# Patient Record
Sex: Male | Born: 1975 | Race: Black or African American | Hispanic: No | Marital: Married | Smoking: Current some day smoker
Health system: Southern US, Community
[De-identification: ages and names within clinical notes are randomized; demographics above are authoritative.]

## PROBLEM LIST (undated history)

## (undated) DIAGNOSIS — R079 Chest pain, unspecified: Secondary | ICD-10-CM

## (undated) DIAGNOSIS — I10 Essential (primary) hypertension: Secondary | ICD-10-CM

## (undated) DIAGNOSIS — E785 Hyperlipidemia, unspecified: Secondary | ICD-10-CM

## (undated) DIAGNOSIS — Z720 Tobacco use: Secondary | ICD-10-CM

## (undated) DIAGNOSIS — R011 Cardiac murmur, unspecified: Secondary | ICD-10-CM

## (undated) DIAGNOSIS — N529 Male erectile dysfunction, unspecified: Secondary | ICD-10-CM

## (undated) HISTORY — DX: Hyperlipidemia, unspecified: E78.5

## (undated) HISTORY — DX: Tobacco use: Z72.0

## (undated) HISTORY — DX: Cardiac murmur, unspecified: R01.1

## (undated) HISTORY — DX: Male erectile dysfunction, unspecified: N52.9

---

## 2005-01-27 ENCOUNTER — Emergency Department (HOSPITAL_COMMUNITY): Admission: EM | Admit: 2005-01-27 | Discharge: 2005-01-27 | Payer: Self-pay | Admitting: Emergency Medicine

## 2005-08-27 ENCOUNTER — Emergency Department (HOSPITAL_COMMUNITY): Admission: EM | Admit: 2005-08-27 | Discharge: 2005-08-27 | Payer: Self-pay | Admitting: Family Medicine

## 2007-05-20 ENCOUNTER — Emergency Department (HOSPITAL_COMMUNITY): Admission: EM | Admit: 2007-05-20 | Discharge: 2007-05-21 | Payer: Self-pay | Admitting: Emergency Medicine

## 2007-05-27 ENCOUNTER — Emergency Department (HOSPITAL_COMMUNITY): Admission: EM | Admit: 2007-05-27 | Discharge: 2007-05-27 | Payer: Self-pay | Admitting: Emergency Medicine

## 2007-06-09 ENCOUNTER — Ambulatory Visit: Payer: Self-pay | Admitting: Cardiology

## 2007-06-17 ENCOUNTER — Observation Stay (HOSPITAL_COMMUNITY): Admission: AD | Admit: 2007-06-17 | Discharge: 2007-06-17 | Payer: Self-pay | Admitting: Cardiovascular Disease

## 2007-06-17 HISTORY — PX: CARDIAC CATHETERIZATION: SHX172

## 2007-07-08 ENCOUNTER — Inpatient Hospital Stay (HOSPITAL_COMMUNITY): Admission: EM | Admit: 2007-07-08 | Discharge: 2007-07-11 | Payer: Self-pay | Admitting: Emergency Medicine

## 2007-07-08 ENCOUNTER — Ambulatory Visit: Payer: Self-pay | Admitting: Cardiology

## 2007-07-30 ENCOUNTER — Emergency Department (HOSPITAL_COMMUNITY): Admission: EM | Admit: 2007-07-30 | Discharge: 2007-07-30 | Payer: Self-pay | Admitting: Emergency Medicine

## 2007-10-26 ENCOUNTER — Emergency Department (HOSPITAL_COMMUNITY): Admission: EM | Admit: 2007-10-26 | Discharge: 2007-10-26 | Payer: Self-pay | Admitting: Emergency Medicine

## 2007-11-29 ENCOUNTER — Emergency Department (HOSPITAL_COMMUNITY): Admission: EM | Admit: 2007-11-29 | Discharge: 2007-11-30 | Payer: Self-pay | Admitting: Emergency Medicine

## 2007-12-19 ENCOUNTER — Emergency Department (HOSPITAL_COMMUNITY): Admission: EM | Admit: 2007-12-19 | Discharge: 2007-12-20 | Payer: Self-pay | Admitting: Emergency Medicine

## 2008-03-05 ENCOUNTER — Emergency Department (HOSPITAL_COMMUNITY): Admission: EM | Admit: 2008-03-05 | Discharge: 2008-03-05 | Payer: Self-pay | Admitting: Emergency Medicine

## 2008-09-24 IMAGING — CR DG CHEST 2V
2 series · 2 of 2 positions shown · non-contrast
Comparison: 10/26/2007.

CLINICAL DATA: Left chest pain.

CHEST - 2 VIEW

[w chest pa]
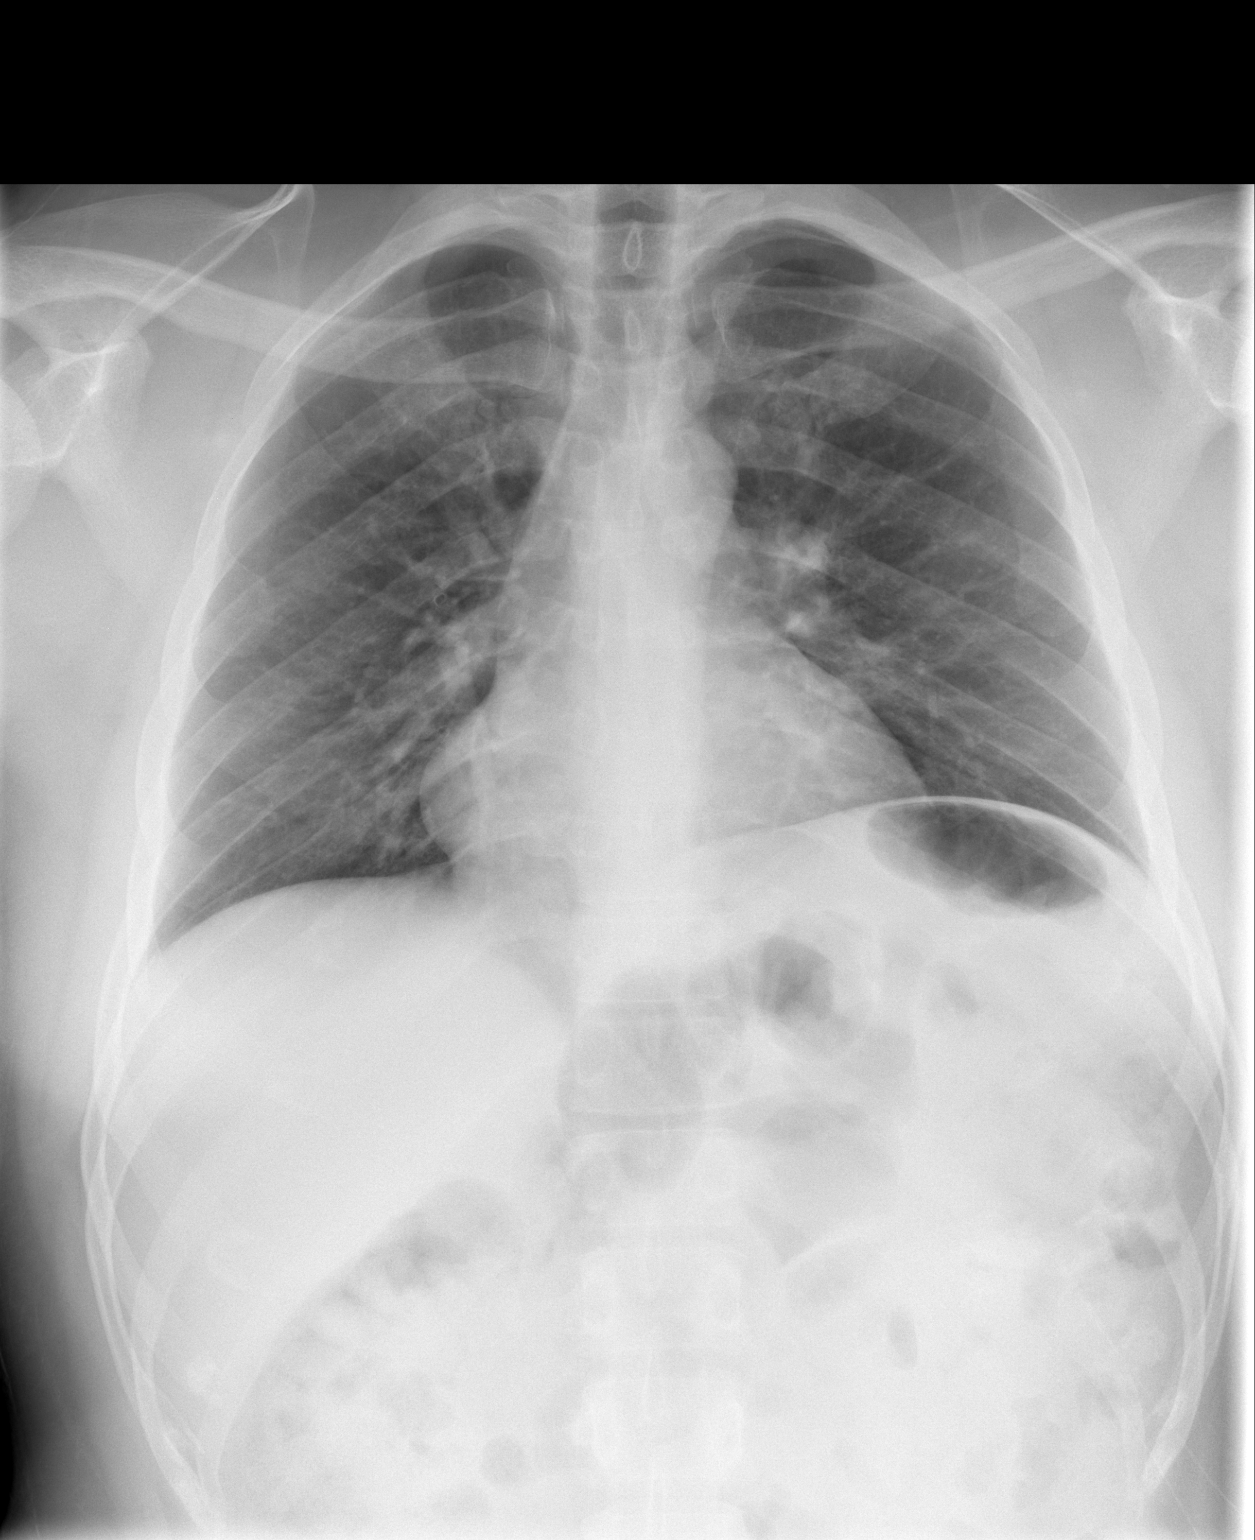

[w chest lat]
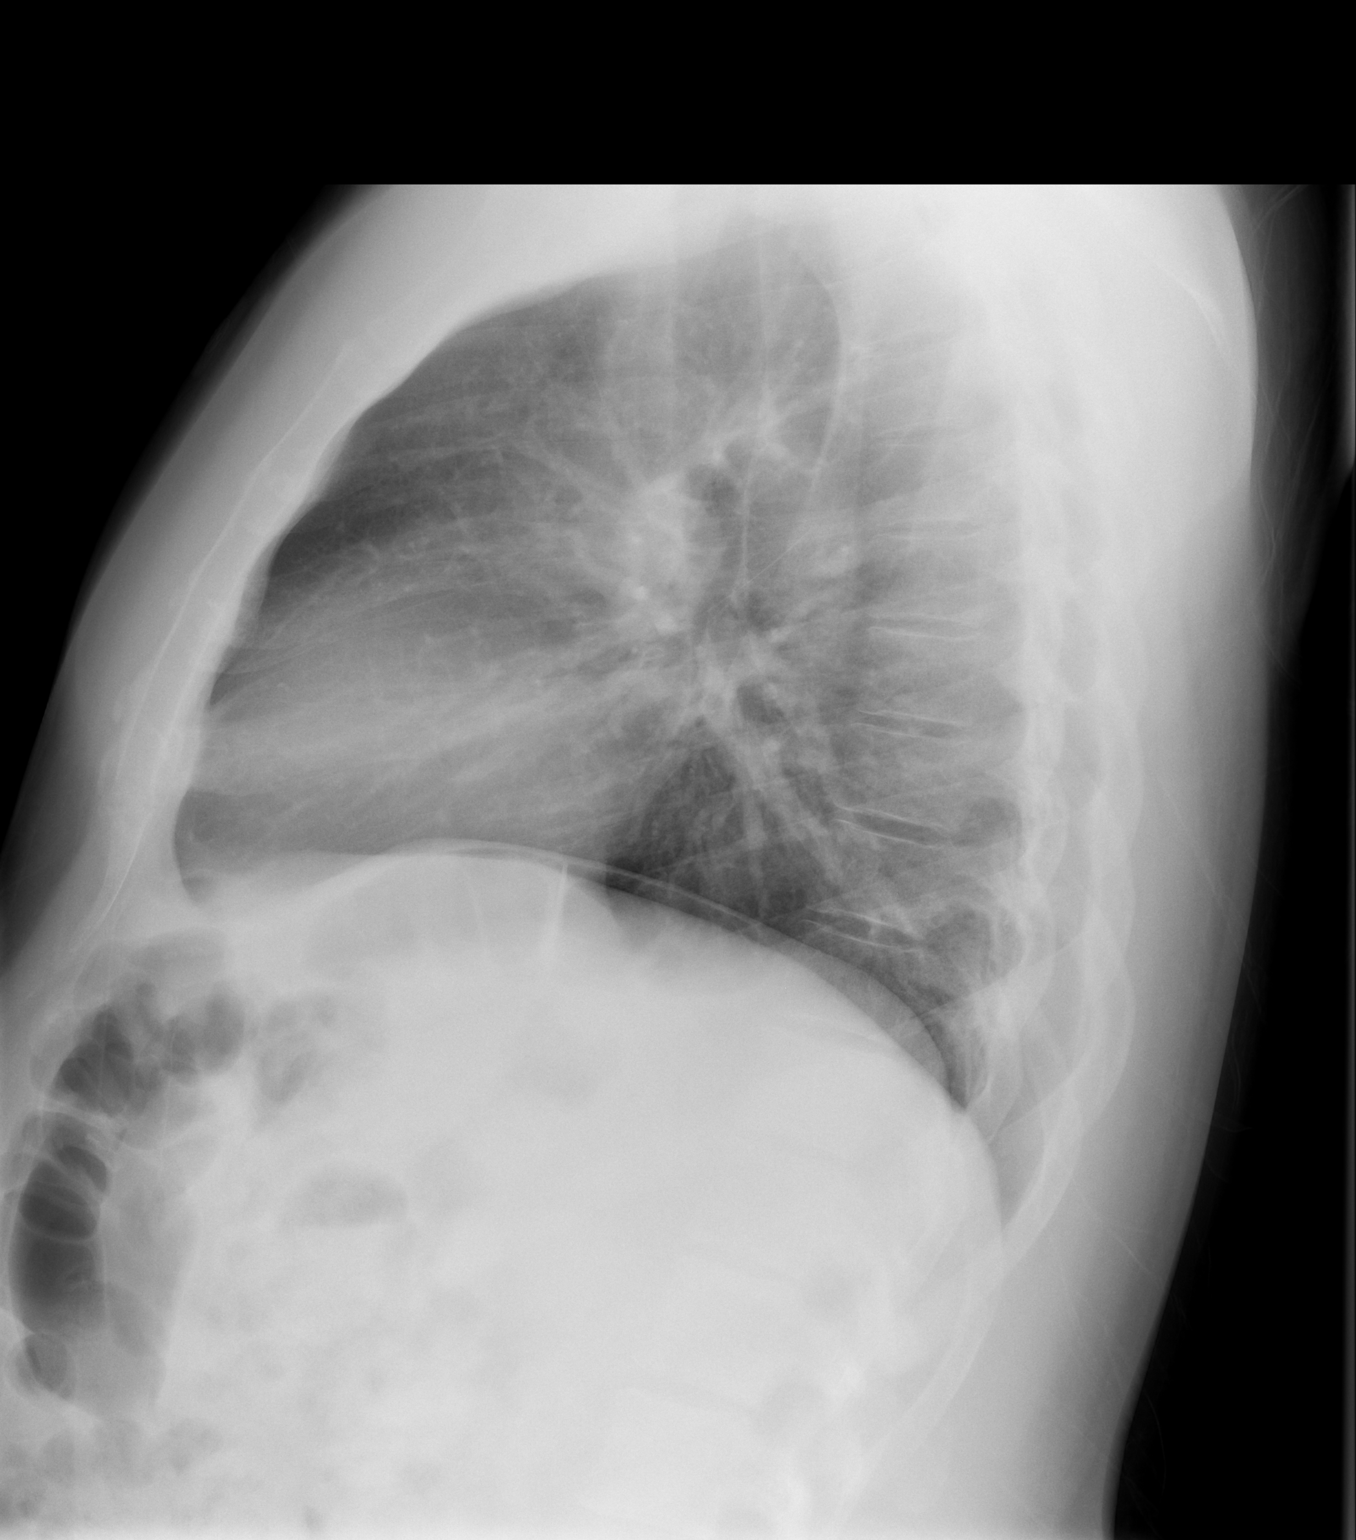

[2 of 2 positions shown; findings below may reference images not displayed]

FINDINGS: The heart size and mediastinal contours are stable.  The
lungs are clear.  There is no pleural effusion or pneumothorax.  No
acute osseous findings are seen.
IMPRESSION: Stable examination.  No acute chest findings.

## 2010-11-11 NOTE — Procedures (Signed)
NAME:  SIAOSI, ALTER NO.:  [id number]   MEDICAL RECORD NO.:  [id number]          PATIENT TYPE:  INP   LOCATION:  A210                          FACILITY:  [facility]   PHYSICIAN:  Gerrit Friends. Dietrich Pates, MD, FACCDATE OF BIRTH:  Sep 17, 1975   DATE OF PROCEDURE:  07/11/2007  DATE OF DISCHARGE:  07/11/2007                                ECHOCARDIOGRAM   REFERRING PHYSICIAN:  Dr. Lilian Kapur and Dr. Dietrich Pates.   CLINICAL DATA:  A 35 year old gentleman with chest pain and  hypertension.   Aorta 2.9, left atrium 4.2.   1. Technically adequate echocardiographic study.  2. Mild left atrial enlargement; normal right atrium and right      ventricle.  3. Normal aortic valve; normal proximal ascending aorta.  4. Normal mitral valve with trivial regurgitation.  5. Normal tricuspid valve with physiologic regurgitation.  6. Normal left ventricular size; borderline septal hypertrophy; some      could degree of hypokinesis at the base of the inferior wall;      overall LV systolic function is normal.      Gerrit Friends. Dietrich Pates, MD, [facility]  Electronically Signed     RMR/MEDQ  D:  07/13/2007  T:  07/13/2007  Job:  [id number]

## 2010-11-11 NOTE — Group Therapy Note (Signed)
NAME:  KARTEL, William NO.:  [id number]   MEDICAL RECORD NO.:  [id number]          PATIENT TYPE:  INP   LOCATION:  A210                          FACILITY:  [facility]   PHYSICIAN:  Skeet Latch, DO    DATE OF BIRTH:  07-01-1975   DATE OF PROCEDURE:  DATE OF DISCHARGE:                                 PROGRESS NOTE   SUBJECTIVE:  Mr. Berry still is complaining of left sternal chest  pain.  The patient continues to be on a nitroglycerin drip, states that  the pain is pretty much the same and has not changed in nature.  The  patient is lying in bed, does not appear in any acute distress, but  states that his chest pain has not greatly improved.   OBJECTIVE:  VITAL SIGNS:  Temperature is 98.6, pulse 104, respirations  18, blood pressure 136/74.   PHYSICAL EXAMINATION:  CARDIOVASCULAR:  Slightly tachycardic.  No  murmurs, rubs or gallops noted.  RESPIRATORY:  Lungs are clear to auscultation bilaterally.  No rales,  rhonchi or wheezing.  ABDOMEN:  Soft, nontender, nondistended.  No rigidity or guarding.  EXTREMITIES:  No clubbing, cyanosis or edema.   LABORATORY:  Pending at this time.   ASSESSMENT/PLAN:  1. Chest pain does not seem to be improved today.  So far, the      patient's cardiac enzymes, EKG in our workup has been negative.  At      this time, will discontinue his nitroglycerin drip and continue      patient on IV pain medication to see if he improves.  Cardiology      has been consulted.  However, secondary to it being the weekend,      cardiology will not see patient for the next two days.  Will      closely monitor patient over the next few days.  After      discontinuing his nitroglycerin drip, will see how patient does.      If patient has improved over the next day or two, may discharge      with outpatient followup with cardiology.  The patient did have a      negative heart catheterization a few months prior, and if his labs      continue to be  normal, possibly may be discharged prior to Monday.      I will continue to follow patient closely on telemetry.  Will add      Xanax to his regimen at this time.      Skeet Latch, DO  Electronically Signed     SM/MEDQ  D:  07/09/2007  T:  07/09/2007  Job:  [phone]

## 2010-11-11 NOTE — Cardiovascular Report (Signed)
NAME:  William Berry, KEESEY NO.:  [id number]   MEDICAL RECORD NO.:  [id number]          PATIENT TYPE:  INP   LOCATION:  6527                         FACILITY:  [facility]   PHYSICIAN:  Ricki Rodriguez, M.D.  DATE OF BIRTH:  10-07-1975   DATE OF PROCEDURE:  06/17/2007  DATE OF DISCHARGE:                            CARDIAC CATHETERIZATION   PROCEDURE:  Left heart catheterization, selective coronary angiography  and left ventricular function study.   INDICATIONS:  This is a 35 year old, black male with recurrent chest  pain and no significant relief with morphine and nitroglycerin use.   APPROACH:  Right femoral artery using 4-French sheath and catheters.   HEMODYNAMIC DATA:  Left ventricular pressure was 109/21.  Aortic  pressure was 107/76.   COMPLICATIONS:  None.   DYE USED:  Less than 50 mL of dye was used.   CORONARY ANATOMY:  The left main coronary artery was short and  unremarkable.   Left anterior descending coronary artery:  The left anterior descending  coronary artery was unremarkable in proximal one-third of the vessel  followed by narrowing of long segment improving with distally dye  injection suggestive of spasm.  Diagonal vessel was unremarkable.   Left circumflex coronary artery:  The left circumflex coronary artery  was a large vessel and had a very large obtuse marginal branch.  The  ramus branch was a very narrow vessel.   Right coronary artery:  The right coronary artery was dominant, but had  very small posterior descending coronary artery and posterolateral  branch.   Left ventriculogram:  The left ventriculogram showed normal left  systolic function with ejection fraction of 65%.   IMPRESSION:  1. 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  2. Normal left ventricular systolic function.   RECOMMENDATIONS:  This patient will continue medical therapy.  He may  use a small dose of calcium channel blocker for  his coronary spasm.      Ricki Rodriguez, M.D.  Electronically Signed     ASK/MEDQ  D:  06/17/2007  T:  06/18/2007  Job:  [id number]

## 2010-11-11 NOTE — Group Therapy Note (Signed)
NAME:  William Berry, William Berry NO.:  [id number]   MEDICAL RECORD NO.:  [id number]          PATIENT TYPE:  INP   LOCATION:  A210                          FACILITY:  [facility]   PHYSICIAN:  Skeet Latch, DO    DATE OF BIRTH:  01/20/1976   DATE OF PROCEDURE:  07/10/2007  DATE OF DISCHARGE:                                 PROGRESS NOTE   SUBJECTIVE:  William Berry seems to be slightly improved today.  The  patient is still complaining of some chest pain today.  The patient's  nitroglycerin drip was discontinued yesterday.  The patient does not  appear to be in any acute distress at this time.   OBJECTIVE:  VITAL SIGNS:  Temperature is 99, pulse 114, respirations 20,  blood pressure is 161/70.  CARDIOVASCULAR:  He is tachycardic, no rubs,  murmurs or gallops.  LUNGS:  Clear to auscultation bilaterally, no  rales, rhonchi or wheezing.  ABDOMEN:  Soft, nontender and non-  distended.  No rigidity or guarding.  Positive bowel sounds.  EXTREMITIES:  There is no clubbing, cyanosis, or edema noted.   ASSESSMENT AND PLAN:  Chest pain:  The patient seems to be improved  today.  So far, his workup has been negative for any kind of ischemic  event.  The patient does remain tachycardic and his blood pressure is  slightly elevated today.  We will add Lopressor to his regimen at this  time.  Cardiology has been consulted; they will see the patient  tomorrow.  We will await their recommendations if the patient may need  another stress test.  The patient did have a negative cardiac  catheterization a few months prior.  The patient does not seem to be  high risk at this time, but we will still follow him closely until he is  evaluated by Cardiology.      Skeet Latch, DO  Electronically Signed     SM/MEDQ  D:  07/10/2007  T:  07/10/2007  Job:  [id number]

## 2010-11-11 NOTE — Consult Note (Signed)
NAME:  William Berry, William Berry NO.:  [id number]   MEDICAL RECORD NO.:  [id number]          PATIENT TYPE:  INP   LOCATION:  A210                          FACILITY:  [facility]   PHYSICIAN:  Gerrit Friends. Dietrich Pates, MD, FACCDATE OF BIRTH:  [birth year]   DATE OF CONSULTATION:  07/11/2007  DATE OF DISCHARGE:                                 CONSULTATION   REFERRING PHYSICIAN:  Dr. Elige Radon.   PRIMARY CARDIOLOGIST:  Previously Dr. Algie Coffer   HISTORY OF PRESENT ILLNESS:  A 35 year old gentleman with episodic chest  discomfort over the past month and previous negative cardiac  catheterization now admitted for recurrent symptoms.  William Berry has  no prior cardiac history.  He was transferred from [facility] to  [facility] in mid December and underwent cardiac catheterization that  revealed no obstructive coronary disease.  He did have an area of mid  LAD narrowing that was thought to be due to spasm.  There is no  description of an attempt to inject nitroglycerin to determine if the  apparent spasm would resolve.  The patient has been treated with  amlodipine at low-dose without much benefit.  He feels well most of the  time, but has occasional episodes of severe, sharp left upper chest  stabbing discomfort that is interspersed with a less intense dull  residual pain.  He has associated diaphoresis but no dyspnea.  There is  no radiation.  There is no association to activity, body position, or  meals.  The patient reports no response to sublingual nitroglycerin.  He  has benefited from Dilaudid in-hospital, but developed GI symptoms that  are improved under treatment with Phenergan.   PAST MEDICAL HISTORY:  Unremarkable.  She has no other issues that have  required hospitalization, surgery, nor intensive medical therapy.   ALLERGIES:  TORADOL.   MEDICATIONS ON ADMISSION:  1. Amlodipine 2.5 mg daily.  2. Flexeril p.r.n.  3. Aspirin 81 mg daily.  4. Amoxicillin and Tylenol #3 for  recent dental work.   FAMILY HISTORY:  Positive for coronary disease.   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  No use of tobacco products, alcohol, nor illicit  drugs.   REVIEW OF SYSTEMS:  Notable for intermittent headaches; he has been  evaluated in the emergency department on at least 2 occasions for  migraine.  He receives no vaccinations.  He notes occasional  constipation and follows a regular diet.  All other systems reviewed and  are negative.   EXAM:  Pleasant, healthy-appearing gentleman.  The temperature is 98.2, heart rate 95 and regular, respirations 18,  blood pressure 130/80.  O2 saturation 95% on room air.  HEENT:  EOMs full; normal oral mucosa.  NECK:  No jugular venous distention; normal carotid upstrokes without  bruits.  ENDOCRINE:  No thyromegaly.  SKIN:  No significant abnormalities.  PSYCHIATRIC:  Alert and oriented; normal affect.  LUNGS:  Clear.  CARDIAC:  Normal 1st and 2nd heart sounds; modest basilar systolic  ejection murmur.  ABDOMEN:  Soft and nontender; no organomegaly.  EXTREMITIES:  No edema; bounding distal pulses.  NEUROMUSCULAR:  Symmetric strength and tone; normal cranial nerves.   LABORATORY:  Unremarkable including CBC, chemistry profile, and cardiac  markers.  Chest x-ray is normal.  EKGs are normal on multiple occasions.   IMPRESSION:  Although William Berry had possible spasm at cardiac  catheterization, I doubt that this accounts for his chest discomfort.  He has no significant ST-segment elevation during symptoms.  His  episodes are prolonged and unlike most spasm.  He had no response to  intravenous or sublingual nitroglycerin, which is also unusual for a  spasm.  We will review his cardiac catheterization when feasible.  For  now, I would be inclined to obtain an echocardiogram to rule out  evidence of pericarditis and a D-dimer level.  We will treat him  symptomatically  with increased amlodipine, sublingual nitroglycerin, and  analgesics.  He need not stay in the hospital.  I will follow him up in  the office.      Gerrit Friends. Dietrich Pates, MD, [facility]  Electronically Signed     RMR/MEDQ  D:  07/11/2007  T:  07/11/2007  Job:  [id number]

## 2010-11-11 NOTE — H&P (Signed)
NAME:  William Berry, William Berry NO.:  [id number]   MEDICAL RECORD NO.:  [id number]          PATIENT TYPE:  INP   LOCATION:  A210                          FACILITY:  [facility]   PHYSICIAN:  Skeet Latch, DO    DATE OF BIRTH:  11-Mar-1976   DATE OF ADMISSION:  07/07/2007  DATE OF DISCHARGE:  LH                              HISTORY & PHYSICAL   CHIEF COMPLAINT:  Chest pain.   HISTORY OF PRESENT ILLNESS:  This is a 35 year old African American male  who presents with complaint of chest pain.  The patient states that  while reading the bible this evening he started getting sharp stabbing  sensation in his left chest.  The patient rates the pain as an 8/10.  He  states it is a sharp, stabbing pain that waxes and wanes.  The patient  had similar pain back in December for which he presented to [facility] and had cardiac catheterization which was negative.  The  patient continues to rate the pain 8/10, was given pain medication and  started on nitroglycerin drip in the emergency room.  Only patient's  associated symptoms, states he is not nauseated at times with the pain,  but no other associated symptoms at this time.   PAST MEDICAL HISTORY:  Unremarkable except for similar pain back in  December.   PAST SURGICAL HISTORY:  Heart catheterization in December.  No other  surgeries.   ALLERGIES:  TORADOL.   HOME MEDICATIONS:  1. Norvasc 2.5 mg daily.  2. Flexeril as needed.  3. Aspirin 81 mg once daily.  4. Amoxil for prior root canal.   FAMILY HISTORY:  Positive for heart disease.   SOCIAL HISTORY:  Denies any tobacco, alcohol or illicit drug use.   REVIEW OF SYSTEMS:  Unremarkable except for cardiovascular, positive for  chest pain and gastrointestinal nausea.   PHYSICAL EXAMINATION:  VITAL SIGNS:  Temperature 97.8, pulse 125,  respirations 16, blood pressure 132/69.  GENERAL:  The patient is alert and oriented, oriented x3.  The patient  is slightly lethargic  on exam and complaining of some chest discomfort.  The patient does not look in any acute distress and is cooperative.  HEENT:  Normocephalic, atraumatic.  Eyes:  PERRLA.  EOMI.  NECK:  Soft, nontender, nondistended.  No JVD.  No thyromegaly.  Oral  mucosa is moist.  CARDIOVASCULAR:  He is tachycardic.  No murmurs appreciated.  No rubs or  gallops.  LUNGS:  Clear to auscultation bilaterally.  No rhonchi, rales or  wheezes.  ABDOMEN:  Soft, nontender, nondistended.  No rigidity or guarding.  No  organosplenomegaly.  EXTREMITIES:  No cyanosis, clubbing or edema.  Good peripheral pulses.  SKIN:  No rashes or bruises noted.  NEUROLOGICAL:  Alert and oriented x3.  Cranial nerves II-XII grossly  intact.  No focal deficits are noted.   LABORATORY DATA:  Last troponin was 0.04, CK MB 0.8, total creatinine  kinase 120.  Sodium 142, potassium 4.2, chloride 110, CO2 26, glucose  159, BUN 6, creatinine 0.94.  White count 8.4, hemoglobin 13.4,  hematocrit 42.1,  platelet count 413.  D. dimer 0.22.  EKG shows sinus  tachycardia.  Otherwise, normal.   ASSESSMENT:  This is a 35 year old [place] American male who presents  with a one day history of chest discomfort.  The patient has no  significant medical history other than having a previous chest pain last  month with negative cardiac catheterization.  The patient presents with  8/10 chest pain that seems to be not relieved.   PLAN:  The patient will be admitted to the telemetry unit for chest  pain.  The patient will be continued on his nitroglycerin drip secondary  to his 8/10 chest discomfort.  Will get another two sets of cardiac  enzymes.  The patient will be maintained on his aspirin as well as  Dilaudid for severe chest pain.  The patient will be maintained on his  Norvasc that was previously prescribed after his negative permanent  cath.  Cardiology also will be consulted with recommendations at this  time.      Skeet Latch, DO   Electronically Signed     SM/MEDQ  D:  07/08/2007  T:  07/08/2007  Job:  [phone]

## 2010-11-11 NOTE — Group Therapy Note (Signed)
NAME:  DELSHAWN, STECH NO.:  [id number]   MEDICAL RECORD NO.:  [id number]          PATIENT TYPE:  INP   LOCATION:  A210                          FACILITY:  [facility]   PHYSICIAN:  Skeet Latch, DO    DATE OF BIRTH:  [birth year]   DATE OF PROCEDURE:  07/11/2007  DATE OF DISCHARGE:                                 PROGRESS NOTE   SUBJECTIVE:  Mr. Mullendore still has chest pain.  The patient has  continued to have chest pain since the day of admission.  Does not seem  as severe but still is complaining of left substernal chest discomfort.  On my exam, the patient was lying in bed, no acute distress.   OBJECTIVE:  VITAL SIGNS:  Temperature 98.7, pulse 95, respirations 20,  blood pressure 137/79.  CARDIOVASCULAR:  Regular rate and rhythm.  No murmurs, rubs or gallops.  LUNGS:  Clear to auscultation bilaterally.  No rhonchi, rales or  wheezes.  ABDOMEN:  Soft, nontender, nondistended.  Positive bowel sounds.  No  rigidity or guarding.  EXTREMITIES:  No cyanosis, clubbing or edema.   LABORATORY DATA:  Lipid panel:  Cholesterol 144, triglycerides 57, HDL  44, LDL 89.  Sodium 134, potassium 3.7, chloride 100, CO2 27, glucose  96, BUN 5, creatinine 0.90, white count 6.0, hemoglobin 30.3, hematocrit  41.6, platelet count 313.   ASSESSMENT/PLAN:  Chest pain.  The patient's workup so far has been  negative.  The patient is scheduled to be seen by Cardiology today.  Will await recommendations regarding any follow up workup.  Lopressor  was added to his regimen yesterday due to some tachycardia.  Seems to be  stable at this time.  Anticipate the patient being discharged in the  next 24-48 hours and is stable with outpatient follow up.      Skeet Latch, DO  Electronically Signed     SM/MEDQ  D:  07/11/2007  T:  07/11/2007  Job:  [id number]

## 2010-11-14 NOTE — Discharge Summary (Signed)
NAME:  HARI, CASAUS NO.:  [id number]   MEDICAL RECORD NO.:  [id number]          PATIENT TYPE:  INP   LOCATION:  A210                          FACILITY:  [facility]   PHYSICIAN:  Skeet Latch, DO    DATE OF BIRTH:  14-Oct-1975   DATE OF ADMISSION:  07/07/2007  DATE OF DISCHARGE:  01/12/2009LH                               DISCHARGE SUMMARY   DISCHARGE DIAGNOSES:  Atypical chest pain.   HISTORY:  1. History of muscle spasms.  2. Hypertension.   BRIEF HOSPITAL COURSE:  Mr. Bergum is a 35 year old African American  male who presented with chest pain complaint.  The patient states that  he started having chest pain while reading; and rated the pain at  8 out  of  10.  He states the pain was sharp in nature, stabbing; no waxing or  waning.  The patient had similar  pain back in December and went to  [facility].  He had a cardiac catheterization that was negative.  The  patient presented with similar complaints.  His initial cardiac enzymes  were normal.  His EKG showed sinus tachycardia.  The patient was  admitted, placed on his home medications.  He continued with 0 cardiac  enzymes, while EKGs were in normal range, and cardiology was consulted.   The patient underwent an echocardiogram that was normal.  A consultation  by [facility] cardiology thought that his chest pain was probably not  cardiac in nature.  He had no significant ST elevation during his  symptoms.  They recommended treating the patient symptomatically and  increase his amlodipine, continue his home oxygen, sublingual  nitroglycerin and analgesics.   The patient's pain seemed to be improving during hospital stay.  The  patient was discharged on July 11, 2007.   DISCHARGE MEDICATIONS:  1. Flexeril as needed.  2. Norvasc 10 mg once a day.  3. DISCONTINUE his amoxicillin prior dose for his root canal.  4. Aspirin 81 mg daily.   The patient was told to follow with [facility]iology in 1 week.   The  patient was discharged in stable condition.   DISPOSITION:  Home with the following instructions:  The patient is to  maintain a low-sodium diet, increase his activity slowly, and follow up  with Cardiology in 1 week.  The patient was to continue medications as  noted above.      Skeet Latch, DO  Electronically Signed     SM/MEDQ  D:  08/08/2007  T:  08/10/2007  Job:  [id number]

## 2011-03-19 LAB — COMPREHENSIVE METABOLIC PANEL
ALT: 37
AST: 24
Albumin: 3.7
Alkaline Phosphatase: 48
CO2: 27
Chloride: 110
GFR calc Af Amer: >60

## 2011-03-19 LAB — CBC
HCT: 41.6
HCT: 42.1
HCT: 46
HCT: 43.5
Hemoglobin: 13.4
MCV: 84.6
MCV: 85.4
Platelets: 328
Platelets: 413 — ABNORMAL HIGH
RBC: 4.89
RBC: 4.98
RBC: 5.09
RDW: 13.9
WBC: 6.3
WBC: 5.7

## 2011-03-19 LAB — CARDIAC PANEL(CRET KIN+CKTOT+MB+TROPI)
CK, MB: 0.7
CK, MB: 0.8
Relative Index: INVALID
Troponin I: 0.01
Troponin I: 0.04

## 2011-03-19 LAB — DIFFERENTIAL
Basophils Absolute: 0
Basophils Absolute: 0
Basophils Absolute: 0.1
Basophils Relative: 1
Basophils Relative: 1
Eosinophils Absolute: 0.2
Eosinophils Relative: 1
Eosinophils Relative: 3
Lymphocytes Relative: 45
Lymphs Abs: 2.6
Lymphs Abs: 2.9
Monocytes Absolute: 0.7
Monocytes Absolute: 0.9
Monocytes Absolute: 0.9
Monocytes Relative: 11
Monocytes Relative: 11
Monocytes Relative: 16 — ABNORMAL HIGH
Neutro Abs: 4.8
Neutrophils Relative %: 42 — ABNORMAL LOW
Neutrophils Relative %: 58

## 2011-03-19 LAB — BASIC METABOLIC PANEL
CO2: 26
Calcium: 8.6
Calcium: 8.8
Calcium: 8.9
Chloride: 101
Chloride: 102
Creatinine, Ser: 0.9
GFR calc Af Amer: >60
GFR calc Af Amer: >60
GFR calc non Af Amer: >60
GFR calc non Af Amer: >60
Potassium: 3.4 — ABNORMAL LOW
Sodium: 134 — ABNORMAL LOW
Sodium: 136

## 2011-03-19 LAB — LIPID PANEL
LDL Cholesterol: 89
Total CHOL/HDL Ratio: 3.3

## 2011-03-19 LAB — RAPID URINE DRUG SCREEN, HOSP PERFORMED
Barbiturates: NOT DETECTED
Benzodiazepines: NOT DETECTED
Cocaine: NOT DETECTED

## 2011-03-19 LAB — POCT CARDIAC MARKERS
CKMB, poc: <1 — ABNORMAL LOW
Myoglobin, poc: 54.7
Operator id: 106841
Operator id: 282261
Troponin i, poc: <0.05
Troponin i, poc: <0.05

## 2011-03-19 LAB — D-DIMER, QUANTITATIVE: D-Dimer, Quant: 0.22

## 2011-03-24 LAB — COMPREHENSIVE METABOLIC PANEL
ALT: 33
Albumin: 4
Alkaline Phosphatase: 61
BUN: 4 — ABNORMAL LOW
Potassium: 4.1
Sodium: 142
Total Protein: 7.8

## 2011-03-24 LAB — DIFFERENTIAL
Basophils Relative: 2 — ABNORMAL HIGH
Monocytes Absolute: 0.4
Monocytes Relative: 7
Neutro Abs: 2.7

## 2011-03-24 LAB — POCT CARDIAC MARKERS
CKMB, poc: <1 — ABNORMAL LOW
Troponin i, poc: <0.05

## 2011-03-24 LAB — CBC
Platelets: 396
RDW: 13.3

## 2011-03-24 LAB — RAPID URINE DRUG SCREEN, HOSP PERFORMED
Amphetamines: NOT DETECTED
Benzodiazepines: NOT DETECTED
Cocaine: NOT DETECTED
Opiates: POSITIVE — AB
Tetrahydrocannabinol: NOT DETECTED

## 2011-03-24 LAB — URINALYSIS, ROUTINE W REFLEX MICROSCOPIC
Bilirubin Urine: NEGATIVE
Glucose, UA: NEGATIVE
Hgb urine dipstick: NEGATIVE
Specific Gravity, Urine: 1.028
Urobilinogen, UA: 1

## 2011-03-26 LAB — DIFFERENTIAL
Basophils Absolute: 0.2 — ABNORMAL HIGH
Eosinophils Relative: 1
Lymphocytes Relative: 43
Lymphocytes Relative: 44
Lymphs Abs: 3
Monocytes Absolute: 0.7
Monocytes Relative: 12
Neutro Abs: 2.5
Neutro Abs: 2.7
Neutrophils Relative %: 43

## 2011-03-26 LAB — CBC
HCT: 43.9
Hemoglobin: 14.6
Platelets: 361
RBC: 5.22
RDW: 15.1
WBC: 5.9
WBC: 6.7

## 2011-03-26 LAB — POCT CARDIAC MARKERS
CKMB, poc: 1.8
CKMB, poc: 2.5
CKMB, poc: <1 — ABNORMAL LOW
Myoglobin, poc: 37.8
Myoglobin, poc: 44.7
Myoglobin, poc: 53.6
Operator id: 294511
Operator id: 270651
Operator id: 270651
Operator id: 270651
Troponin i, poc: <0.05
Troponin i, poc: <0.05
Troponin i, poc: <0.05
Troponin i, poc: <0.05

## 2011-03-26 LAB — POCT I-STAT, CHEM 8
Calcium, Ion: 1.16
Calcium, Ion: 1.17
Chloride: 107
HCT: 48
HCT: 48
Hemoglobin: 16.3
Hemoglobin: 16.3
Potassium: 3.8
Sodium: 140
TCO2: 27

## 2011-03-26 LAB — RAPID URINE DRUG SCREEN, HOSP PERFORMED
Cocaine: NOT DETECTED
Opiates: NOT DETECTED
Tetrahydrocannabinol: NOT DETECTED

## 2011-03-26 LAB — D-DIMER, QUANTITATIVE: D-Dimer, Quant: 0.24

## 2011-04-01 LAB — DIFFERENTIAL
Basophils Absolute: 0
Basophils Relative: 0
Eosinophils Absolute: 0.1
Monocytes Absolute: 0.4
Monocytes Relative: 8
Neutro Abs: 3.8
Neutrophils Relative %: 67

## 2011-04-01 LAB — CBC
HCT: 45.6
Hemoglobin: 15
RBC: 5.13
WBC: 5.7

## 2011-04-01 LAB — POCT CARDIAC MARKERS
Troponin i, poc: <0.05
Troponin i, poc: <0.05

## 2011-04-01 LAB — RAPID URINE DRUG SCREEN, HOSP PERFORMED
Amphetamines: NOT DETECTED
Barbiturates: NOT DETECTED
Benzodiazepines: NOT DETECTED
Opiates: NOT DETECTED

## 2011-04-01 LAB — COMPREHENSIVE METABOLIC PANEL
ALT: 24
Alkaline Phosphatase: 62
BUN: 2 — ABNORMAL LOW
Chloride: 109
Glucose, Bld: 91
Potassium: 3.9
Sodium: 140
Total Bilirubin: 0.9
Total Protein: 6.8

## 2011-04-03 LAB — HEPARIN LEVEL (UNFRACTIONATED): Heparin Unfractionated: 0.61

## 2011-04-03 LAB — PROTIME-INR: INR: 1.1

## 2012-09-21 DIAGNOSIS — M79609 Pain in unspecified limb: Secondary | ICD-10-CM

## 2014-10-23 ENCOUNTER — Encounter (HOSPITAL_COMMUNITY): Payer: Self-pay | Admitting: *Deleted

## 2014-10-23 DIAGNOSIS — S0990XA Unspecified injury of head, initial encounter: Secondary | ICD-10-CM | POA: Insufficient documentation

## 2014-10-23 DIAGNOSIS — S060X0A Concussion without loss of consciousness, initial encounter: Secondary | ICD-10-CM | POA: Insufficient documentation

## 2014-10-23 DIAGNOSIS — Y9389 Activity, other specified: Secondary | ICD-10-CM | POA: Insufficient documentation

## 2014-10-23 DIAGNOSIS — Y998 Other external cause status: Secondary | ICD-10-CM | POA: Insufficient documentation

## 2014-10-23 DIAGNOSIS — Y92410 Unspecified street and highway as the place of occurrence of the external cause: Secondary | ICD-10-CM | POA: Insufficient documentation

## 2014-10-23 DIAGNOSIS — Z9861 Coronary angioplasty status: Secondary | ICD-10-CM | POA: Insufficient documentation

## 2014-10-23 DIAGNOSIS — Z720 Tobacco use: Secondary | ICD-10-CM | POA: Insufficient documentation
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
## 2014-10-24 ENCOUNTER — Emergency Department (HOSPITAL_COMMUNITY): Payer: Self-pay

## 2014-10-24 ENCOUNTER — Emergency Department (HOSPITAL_COMMUNITY)
Admission: EM | Admit: 2014-10-24 | Discharge: 2014-10-24 | Disposition: A | Discharge status: Home / Self Care | Payer: Self-pay | Attending: Emergency Medicine | Admitting: Emergency Medicine

## 2014-10-24 DIAGNOSIS — S0990XA Unspecified injury of head, initial encounter: Secondary | ICD-10-CM

## 2014-10-24 DIAGNOSIS — S060X0A Concussion without loss of consciousness, initial encounter: Secondary | ICD-10-CM

## 2014-10-24 HISTORY — DX: Chest pain, unspecified: R07.9
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
## 2014-10-24 NOTE — Discharge Instructions (Signed)
Concussion  A concussion, or closed-head injury, is a brain injury caused by a direct blow to the head or by a quick and sudden movement (jolt) of the head or neck. Concussions are usually not life-threatening. Even so, the effects of a concussion can be serious. If you have had a concussion before, you are more likely to experience concussion-like symptoms after a direct blow to the head.   CAUSES  · Direct blow to the head, such as from running into another player during a soccer game, being hit in a fight, or hitting your head on a hard surface.  · A jolt of the head or neck that causes the brain to move back and forth inside the skull, such as in a car crash.  SIGNS AND SYMPTOMS  The signs of a concussion can be hard to notice. Early on, they may be missed by you, family members, and health care providers. You may look fine but act or feel differently.  Symptoms are usually temporary, but they may last for days, weeks, or even longer. Some symptoms may appear right away while others may not show up for hours or days. Every head injury is different. Symptoms include:  · Mild to moderate headaches that will not go away.  · A feeling of pressure inside your head.  · Having more trouble than usual:  ¨ Learning or remembering things you have heard.  ¨ Answering questions.  ¨ Paying attention or concentrating.  ¨ Organizing daily tasks.  ¨ Making decisions and solving problems.  · Slowness in thinking, acting or reacting, speaking, or reading.  · Getting lost or being easily confused.  · Feeling tired all the time or lacking energy (fatigued).  · Feeling drowsy.  · Sleep disturbances.  ¨ Sleeping more than usual.  ¨ Sleeping less than usual.  ¨ Trouble falling asleep.  ¨ Trouble sleeping (insomnia).  · Loss of balance or feeling lightheaded or dizzy.  · Nausea or vomiting.  · Numbness or tingling.  · Increased sensitivity to:  ¨ Sounds.  ¨ Lights.  ¨ Distractions.  · Vision problems or eyes that tire  easily.  · Diminished sense of taste or smell.  · Ringing in the ears.  · Mood changes such as feeling sad or anxious.  · Becoming easily irritated or angry for little or no reason.  · Lack of motivation.  · Seeing or hearing things other people do not see or hear (hallucinations).  DIAGNOSIS  Your health care provider can usually diagnose a concussion based on a description of your injury and symptoms. He or she will ask whether you passed out (lost consciousness) and whether you are having trouble remembering events that happened right before and during your injury.  Your evaluation might include:  · A brain scan to look for signs of injury to the brain. Even if the test shows no injury, you may still have a concussion.  · Blood tests to be sure other problems are not present.  TREATMENT  · Concussions are usually treated in an emergency department, in urgent care, or at a clinic. You may need to stay in the hospital overnight for further treatment.  · Tell your health care provider if you are taking any medicines, including prescription medicines, over-the-counter medicines, and natural remedies. Some medicines, such as blood thinners (anticoagulants) and aspirin, may increase the chance of complications. Also tell your health care provider whether you have had alcohol or are taking illegal drugs. This information   may affect treatment.  · Your health care provider will send you home with important instructions to follow.  · How fast you will recover from a concussion depends on many factors. These factors include how severe your concussion is, what part of your brain was injured, your age, and how healthy you were before the concussion.  · Most people with mild injuries recover fully. Recovery can take time. In general, recovery is slower in older persons. Also, persons who have had a concussion in the past or have other medical problems may find that it takes longer to recover from their current injury.  HOME  CARE INSTRUCTIONS  General Instructions  · Carefully follow the directions your health care provider gave you.  · Only take over-the-counter or prescription medicines for pain, discomfort, or fever as directed by your health care provider.  · Take only those medicines that your health care provider has approved.  · Do not drink alcohol until your health care provider says you are well enough to do so. Alcohol and certain other drugs may slow your recovery and can put you at risk of further injury.  · If it is harder than usual to remember things, write them down.  · If you are easily distracted, try to do one thing at a time. For example, do not try to watch TV while fixing dinner.  · Talk with family members or close friends when making important decisions.  · Keep all follow-up appointments. Repeated evaluation of your symptoms is recommended for your recovery.  · Watch your symptoms and tell others to do the same. Complications sometimes occur after a concussion. Older adults with a brain injury may have a higher risk of serious complications, such as a blood clot on the brain.  · Tell your teachers, school nurse, school counselor, coach, athletic trainer, or work manager about your injury, symptoms, and restrictions. Tell them about what you can or cannot do. They should watch for:  ¨ Increased problems with attention or concentration.  ¨ Increased difficulty remembering or learning new information.  ¨ Increased time needed to complete tasks or assignments.  ¨ Increased irritability or decreased ability to cope with stress.  ¨ Increased symptoms.  · Rest. Rest helps the brain to heal. Make sure you:  ¨ Get plenty of sleep at night. Avoid staying up late at night.  ¨ Keep the same bedtime hours on weekends and weekdays.  ¨ Rest during the day. Take daytime naps or rest breaks when you feel tired.  · Limit activities that require a lot of thought or concentration. These include:  ¨ Doing homework or job-related  work.  ¨ Watching TV.  ¨ Working on the computer.  · Avoid any situation where there is potential for another head injury (football, hockey, soccer, basketball, martial arts, downhill snow sports and horseback riding). Your condition will get worse every time you experience a concussion. You should avoid these activities until you are evaluated by the appropriate follow-up health care providers.  Returning To Your Regular Activities  You will need to return to your normal activities slowly, not all at once. You must give your body and brain enough time for recovery.  · Do not return to sports or other athletic activities until your health care provider tells you it is safe to do so.  · Ask your health care provider when you can drive, ride a bicycle, or operate heavy machinery. Your ability to react may be slower after a   brain injury. Never do these activities if you are dizzy.  · Ask your health care provider about when you can return to work or school.  Preventing Another Concussion  It is very important to avoid another brain injury, especially before you have recovered. In rare cases, another injury can lead to permanent brain damage, brain swelling, or death. The risk of this is greatest during the first 7-10 days after a head injury. Avoid injuries by:  · Wearing a seat belt when riding in a car.  · Drinking alcohol only in moderation.  · Wearing a helmet when biking, skiing, skateboarding, skating, or doing similar activities.  · Avoiding activities that could lead to a second concussion, such as contact or recreational sports, until your health care provider says it is okay.  · Taking safety measures in your home.  ¨ Remove clutter and tripping hazards from floors and stairways.  ¨ Use grab bars in bathrooms and handrails by stairs.  ¨ Place non-slip mats on floors and in bathtubs.  ¨ Improve lighting in dim areas.  SEEK MEDICAL CARE IF:  · You have increased problems paying attention or  concentrating.  · You have increased difficulty remembering or learning new information.  · You need more time to complete tasks or assignments than before.  · You have increased irritability or decreased ability to cope with stress.  · You have more symptoms than before.  Seek medical care if you have any of the following symptoms for more than 2 weeks after your injury:  · Lasting (chronic) headaches.  · Dizziness or balance problems.  · Nausea.  · Vision problems.  · Increased sensitivity to noise or light.  · Depression or mood swings.  · Anxiety or irritability.  · Memory problems.  · Difficulty concentrating or paying attention.  · Sleep problems.  · Feeling tired all the time.  SEEK IMMEDIATE MEDICAL CARE IF:  · You have severe or worsening headaches. These may be a sign of a blood clot in the brain.  · You have weakness (even if only in one hand, leg, or part of the face).  · You have numbness.  · You have decreased coordination.  · You vomit repeatedly.  · You have increased sleepiness.  · One pupil is larger than the other.  · You have convulsions.  · You have slurred speech.  · You have increased confusion. This may be a sign of a blood clot in the brain.  · You have increased restlessness, agitation, or irritability.  · You are unable to recognize people or places.  · You have neck pain.  · It is difficult to wake you up.  · You have unusual behavior changes.  · You lose consciousness.  MAKE SURE YOU:  · Understand these instructions.  · Will watch your condition.  · Will get help right away if you are not doing well or get worse.  Document Released: 09/05/2003 Document Revised: 06/20/2013 Document Reviewed: 01/05/2013  [employer]® Patient Information ©2015 [employer]. This information is not intended to replace advice given to you by your health care provider. Make sure you discuss any questions you have with your health care provider.

## 2014-12-10 ENCOUNTER — Emergency Department (HOSPITAL_COMMUNITY): Payer: Self-pay

## 2014-12-10 ENCOUNTER — Emergency Department (HOSPITAL_COMMUNITY)
Admission: EM | Admit: 2014-12-10 | Discharge: 2014-12-10 | Disposition: A | Discharge status: Home / Self Care | Payer: Self-pay | Attending: Emergency Medicine | Admitting: Emergency Medicine

## 2014-12-10 ENCOUNTER — Encounter (HOSPITAL_COMMUNITY): Payer: Self-pay | Admitting: Emergency Medicine

## 2014-12-10 DIAGNOSIS — S299XXA Unspecified injury of thorax, initial encounter: Secondary | ICD-10-CM | POA: Insufficient documentation

## 2014-12-10 DIAGNOSIS — Y9389 Activity, other specified: Secondary | ICD-10-CM | POA: Insufficient documentation

## 2014-12-10 DIAGNOSIS — W01198A Fall on same level from slipping, tripping and stumbling with subsequent striking against other object, initial encounter: Secondary | ICD-10-CM | POA: Insufficient documentation

## 2014-12-10 DIAGNOSIS — Z720 Tobacco use: Secondary | ICD-10-CM | POA: Insufficient documentation

## 2014-12-10 DIAGNOSIS — Z79899 Other long term (current) drug therapy: Secondary | ICD-10-CM | POA: Insufficient documentation

## 2014-12-10 DIAGNOSIS — Y9289 Other specified places as the place of occurrence of the external cause: Secondary | ICD-10-CM | POA: Insufficient documentation

## 2014-12-10 DIAGNOSIS — S0990XA Unspecified injury of head, initial encounter: Secondary | ICD-10-CM | POA: Insufficient documentation

## 2014-12-10 DIAGNOSIS — Y998 Other external cause status: Secondary | ICD-10-CM | POA: Insufficient documentation

## 2014-12-10 DIAGNOSIS — M542 Cervicalgia: Secondary | ICD-10-CM

## 2014-12-10 DIAGNOSIS — W19XXXA Unspecified fall, initial encounter: Secondary | ICD-10-CM

## 2014-12-10 DIAGNOSIS — S199XXA Unspecified injury of neck, initial encounter: Secondary | ICD-10-CM | POA: Insufficient documentation

## 2014-12-10 DIAGNOSIS — R0789 Other chest pain: Secondary | ICD-10-CM

## 2014-12-10 MED ORDER — ACETAMINOPHEN 500 MG PO TABS
1000.0000 mg | ORAL_TABLET | Freq: Once | ORAL | Status: DC
  Filled 2014-12-10: qty 2

## 2014-12-10 MED ORDER — CYCLOBENZAPRINE HCL 10 MG PO TABS: 10.0000 mg | ORAL_TABLET | Freq: Two times a day (BID) | ORAL | Status: DC | PRN

## 2014-12-10 MED ORDER — HYDROCODONE-ACETAMINOPHEN 5-325 MG PO TABS: 1.0000 | ORAL_TABLET | ORAL | Status: DC | PRN

## 2014-12-10 NOTE — Discharge Instructions (Signed)
All x-rays were normal. You will be sore for several days. Prescription for pain medicine and muscle relaxer

## 2014-12-10 NOTE — ED Provider Notes (Signed)
CSN: [id number]     Arrival date & time 12/10/14  1735 History   First MD Initiated Contact with Patient 12/10/14 1925     Chief Complaint  Patient presents with  . Fall  . Abdominal Pain  . Head Injury     (Consider location/radiation/quality/duration/timing/severity/associated sxs/prior Treatment) HPI.... Patient was assisting his ill wife out of the car when they both slipped and fell. His wife fell on his chest. Patient's occipital area hit the ground. Patient complains of headache, neck pain, right chest wall pain. No loss of consciousness or neurological deficits. Patient is ambulatory.   Past Medical History  Diagnosis Date  . Chest pain    Past Surgical History  Procedure Laterality Date  . Coronary angioplasty     History reviewed. No pertinent family history. History  Substance Use Topics  . Smoking status: Current Some Day Smoker  . Smokeless tobacco: Not on file  . Alcohol Use: No    Review of Systems  All other systems reviewed and are negative.     Allergies  Toradol  Home Medications   Prior to Admission medications   Medication Sig Start Date End Date Taking? Authorizing Provider  Multiple Vitamin (MULTIVITAMIN WITH MINERALS) TABS tablet Take 1 tablet by mouth daily.   Yes Historical Provider, MD  cyclobenzaprine (FLEXERIL) 10 MG tablet Take 1 tablet (10 mg total) by mouth 2 (two) times daily as needed for muscle spasms. 12/10/14   Donnetta Hutching, MD  HYDROcodone-acetaminophen (NORCO/VICODIN) 5-325 MG per tablet Take 1-2 tablets by mouth every 4 (four) hours as needed. 12/10/14   Donnetta Hutching, MD   BP 169/103 mmHg  Pulse 87  Temp(Src) 98.4 F (36.9 C) (Oral)  Resp 18  Ht 5\' 11"  (1.803 m)  Wt 200 lb (90.719 kg)  BMI 27.91 kg/m2  SpO2 99% Physical Exam  Constitutional: He is oriented to person, place, and time. He appears well-developed and well-nourished.  HENT:  Head: Normocephalic.  Tender occipital area  Eyes: Conjunctivae and EOM are normal.  Pupils are equal, round, and reactive to light.  Neck: Normal range of motion. Neck supple.  Tender posterior cervical spine  Cardiovascular: Normal rate and regular rhythm.   Pulmonary/Chest: Effort normal and breath sounds normal.  Tender right mid anterior lateral chest wall  Abdominal: Soft. Bowel sounds are normal.  Musculoskeletal: Normal range of motion.  Neurological: He is alert and oriented to person, place, and time.  Skin: Skin is warm and dry.  Psychiatric: He has a normal mood and affect. His behavior is normal.  Nursing note and vitals reviewed.   ED Course  Procedures (including critical care time) Labs Review Labs Reviewed - No data to display  Imaging Review Dg Ribs Unilateral W/chest Right  12/10/2014   CLINICAL DATA:  Acute right rib pain after fall today.  EXAM: RIGHT RIBS AND CHEST - 3+ VIEW  COMPARISON:  December 12, 2013.  FINDINGS: No fracture or other bone lesions are seen involving the ribs. There is no evidence of pneumothorax or pleural effusion. Both lungs are clear. Heart size and mediastinal contours are within normal limits.  IMPRESSION: Normal right ribs.  No acute cardiopulmonary abnormality seen.   Electronically Signed   By: Lupita Raider, M.D.   On: 12/10/2014 20:33   Ct Head Wo Contrast  12/10/2014   CLINICAL DATA:  Fall, striking the back of the head on the ground. Soreness lung the head. Neck pain.  EXAM: CT HEAD WITHOUT CONTRAST  CT  CERVICAL SPINE WITHOUT CONTRAST  TECHNIQUE: Multidetector CT imaging of the head and cervical spine was performed following the standard protocol without intravenous contrast. Multiplanar CT image reconstructions of the cervical spine were also generated.  COMPARISON:  A Multiple exams, including 10/24/2014 and 09/12/2014  FINDINGS: CT HEAD FINDINGS  The brainstem, cerebellum, cerebral peduncles, thalamus, basal ganglia, basilar cisterns, and ventricular system appear within normal limits. No intracranial hemorrhage, mass  lesion, or acute CVA. Stable small polyp or mucous retention cyst of the right maxillary sinus.  CT CERVICAL SPINE FINDINGS  Reversal of normal cervical lordosis, possibly from spasm or maybe positional. Multilevel uncinate spurring including on the left at C2-3 and C3-4, and bilaterally at C5-6. No fracture or malalignment identified.  IMPRESSION: 1. Stable multilevel uncinate spurring in the cervical spine. No acute cervical spine findings or acute intracranial abnormalities. 2. Small polyp or mucous retention cyst in the right maxillary sinus. 3. Reversal of the normal cervical lordosis, possibly from spasm or possibly just positional.   Electronically Signed   By: Gaylyn Rong M.D.   On: 12/10/2014 20:26   Ct Cervical Spine Wo Contrast  12/10/2014   CLINICAL DATA:  Fall, striking the back of the head on the ground. Soreness lung the head. Neck pain.  EXAM: CT HEAD WITHOUT CONTRAST  CT CERVICAL SPINE WITHOUT CONTRAST  TECHNIQUE: Multidetector CT imaging of the head and cervical spine was performed following the standard protocol without intravenous contrast. Multiplanar CT image reconstructions of the cervical spine were also generated.  COMPARISON:  A Multiple exams, including 10/24/2014 and 09/12/2014  FINDINGS: CT HEAD FINDINGS  The brainstem, cerebellum, cerebral peduncles, thalamus, basal ganglia, basilar cisterns, and ventricular system appear within normal limits. No intracranial hemorrhage, mass lesion, or acute CVA. Stable small polyp or mucous retention cyst of the right maxillary sinus.  CT CERVICAL SPINE FINDINGS  Reversal of normal cervical lordosis, possibly from spasm or maybe positional. Multilevel uncinate spurring including on the left at C2-3 and C3-4, and bilaterally at C5-6. No fracture or malalignment identified.  IMPRESSION: 1. Stable multilevel uncinate spurring in the cervical spine. No acute cervical spine findings or acute intracranial abnormalities. 2. Small polyp or mucous  retention cyst in the right maxillary sinus. 3. Reversal of the normal cervical lordosis, possibly from spasm or possibly just positional.   Electronically Signed   By: Gaylyn Rong M.D.   On: 12/10/2014 20:26     EKG Interpretation None      MDM   Final diagnoses:  Fall, initial encounter  Minor head injury, initial encounter  Neck pain  Right-sided chest wall pain    Patient is alert without neurological deficits. Plain films of right ribs along with CT head and CT cervical spine all negative. Discharge medications Flexeril 10 mg and Norco    Donnetta Hutching, MD 12/10/14 2045

## 2014-12-10 NOTE — ED Notes (Signed)
Patient states he was helping his wife get out of the car and fell with his wife on top of him. States he hit the back of his head. Denies LOC. Complaining of upper abdominal pain and headache.

## 2015-04-10 ENCOUNTER — Encounter (HOSPITAL_COMMUNITY): Payer: Self-pay | Admitting: Emergency Medicine

## 2015-04-10 ENCOUNTER — Emergency Department (HOSPITAL_COMMUNITY)
Admission: EM | Admit: 2015-04-10 | Discharge: 2015-04-10 | Disposition: A | Discharge status: Home / Self Care | Payer: Self-pay | Attending: Emergency Medicine | Admitting: Emergency Medicine

## 2015-04-10 ENCOUNTER — Emergency Department (HOSPITAL_COMMUNITY): Payer: Self-pay

## 2015-04-10 DIAGNOSIS — Z79899 Other long term (current) drug therapy: Secondary | ICD-10-CM | POA: Insufficient documentation

## 2015-04-10 DIAGNOSIS — Z9861 Coronary angioplasty status: Secondary | ICD-10-CM | POA: Insufficient documentation

## 2015-04-10 DIAGNOSIS — Z720 Tobacco use: Secondary | ICD-10-CM | POA: Insufficient documentation

## 2015-04-10 DIAGNOSIS — I158 Other secondary hypertension: Secondary | ICD-10-CM | POA: Insufficient documentation

## 2015-04-10 LAB — BASIC METABOLIC PANEL
Anion gap: 8 (ref 5–15)
BUN: 11 mg/dL (ref 6–20)
CO2: 25 mmol/L (ref 22–32)
CREATININE: 0.97 mg/dL (ref 0.61–1.24)
Calcium: 8.9 mg/dL (ref 8.9–10.3)
Chloride: 103 mmol/L (ref 101–111)
Glucose, Bld: 105 mg/dL — ABNORMAL HIGH (ref 65–99)
Potassium: 4 mmol/L (ref 3.5–5.1)
SODIUM: 136 mmol/L (ref 135–145)

## 2015-04-10 LAB — TROPONIN I

## 2015-04-10 LAB — CBC
HCT: 46.9 % (ref 39.0–52.0)
Hemoglobin: 14.9 g/dL (ref 13.0–17.0)
MCH: 28.3 pg (ref 26.0–34.0)
MCHC: 31.8 g/dL (ref 30.0–36.0)
MCV: 89.2 fL (ref 78.0–100.0)
PLATELETS: 290 10*3/uL (ref 150–400)
RBC: 5.26 MIL/uL (ref 4.22–5.81)
RDW: 14.2 % (ref 11.5–15.5)
WBC: 6.5 10*3/uL (ref 4.0–10.5)

## 2015-04-10 MED ORDER — CLONIDINE HCL 0.1 MG PO TABS
0.1000 mg | ORAL_TABLET | Freq: Once | ORAL | Status: AC
  Administered 2015-04-10: 0.1 mg via ORAL
  Filled 2015-04-10: qty 1

## 2015-04-10 MED ORDER — AMLODIPINE BESYLATE 5 MG PO TABS: 5.0000 mg | ORAL_TABLET | Freq: Once | ORAL | Status: DC

## 2015-04-10 MED ORDER — AMLODIPINE BESYLATE 5 MG PO TABS: 5.0000 mg | ORAL_TABLET | Freq: Every day | ORAL | Status: DC

## 2015-04-10 NOTE — ED Notes (Signed)
Pt resting talking on his cell phone.

## 2015-04-10 NOTE — ED Provider Notes (Signed)
CSN: [id number]     Arrival date & time 04/10/15  1914 History   First MD Initiated Contact with Patient 04/10/15 2043     Chief Complaint  Patient presents with  . Abnormal ECG  . Chest Pain  . Dizziness  . Hypertension     (Consider location/radiation/quality/duration/timing/severity/associated sxs/prior Treatment) HPI Comments: Patient here complaining of mild headache with dizziness and palpitations. Patient was in urgent care was finally hypertensive with a blood pressure 08/19/2018. He was given 4 aspirin and encouraged to come by EMS but he is taking by private vehicle. Denies any associated dyspnea or diaphoresis. Denies any focal neurological deficits. Has a history of hypertension in the past but lost weight and was off his medications. He took Norvasc at that time. 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 Denies any current drug use.  Patient is a 39 y.o. male presenting with chest pain, dizziness, and hypertension. The history is provided by the patient.  Chest Pain Associated symptoms: dizziness   Dizziness Associated symptoms: chest pain   Hypertension Associated symptoms include chest pain.    Past Medical History  Diagnosis Date  . Chest pain    Past Surgical History  Procedure Laterality Date  . Coronary angioplasty     No family history on file. Social History  Substance Use Topics  . Smoking status: Current Some Day Smoker  . Smokeless tobacco: None  . Alcohol Use: No    Review of Systems  Cardiovascular: Positive for chest pain.  Neurological: Positive for dizziness.  All other systems reviewed and are negative.     Allergies  Toradol  Home Medications   Prior to Admission medications   Medication Sig Start Date End Date Taking? Authorizing Provider  cyclobenzaprine (FLEXERIL) 10 MG tablet Take 1 tablet (10 mg total) by mouth 2 (two) times daily as needed for muscle spasms. 12/10/14    Donnetta HutchingBrian Cook, MD  HYDROcodone-acetaminophen (NORCO/VICODIN) 5-325 MG per tablet Take 1-2 tablets by mouth every 4 (four) hours as needed. 12/10/14   Donnetta HutchingBrian Cook, MD  Multiple Vitamin (MULTIVITAMIN WITH MINERALS) TABS tablet Take 1 tablet by mouth daily.    Historical Provider, MD   BP 204/109 mmHg  Pulse 94  Temp(Src) 98.5 F (36.9 C) (Oral)  Resp 18  Ht 5\' 11"  (1.803 m)  Wt 216 lb (97.977 kg)  BMI 30.14 kg/m2  SpO2 100% Physical Exam  Constitutional: He is oriented to person, place, and time. He appears well-developed and well-nourished.  Non-toxic appearance. No distress.  HENT:  Head: Normocephalic and atraumatic.  Eyes: Conjunctivae, EOM and lids are normal. Pupils are equal, round, and reactive to light.  Neck: Normal range of motion. Neck supple. No tracheal deviation present. No thyroid mass present.  Cardiovascular: Normal rate, regular rhythm and normal heart sounds.  Exam reveals no gallop.   No murmur heard. Pulmonary/Chest: Effort normal and breath sounds normal. No stridor. No respiratory distress. He has no decreased breath sounds. He has no wheezes. He has no rhonchi. He has no rales.  Abdominal: Soft. Normal appearance and bowel sounds are normal. He exhibits no distension. There is no tenderness. There is no rebound and no CVA tenderness.  Musculoskeletal: Normal range of motion. He exhibits no edema or tenderness.  Neurological: He is alert and oriented to person, place, and time. He has normal strength. No cranial nerve deficit or sensory deficit. GCS eye subscore is 4. GCS verbal subscore is  5. GCS motor subscore is 6.  Skin: Skin is warm and dry. No abrasion and no rash noted.  Psychiatric: He has a normal mood and affect. His speech is normal and behavior is normal.  Nursing note and vitals reviewed.   ED Course  Procedures (including critical care time) Labs Review Labs Reviewed  BASIC METABOLIC PANEL - Abnormal; Notable for the following:    Glucose, Bld 105  (*)    All other components within normal limits  CBC  TROPONIN I    Imaging Review Dg Chest 2 View  04/10/2015  CLINICAL DATA:  Left chest pain beginning today. Bilateral feet swelling. EXAM: CHEST  2 VIEW COMPARISON:  12/14/2014 FINDINGS: The heart size and mediastinal contours are within normal limits. Both lungs are clear. The visualized skeletal structures are unremarkable. IMPRESSION: No active cardiopulmonary disease. Electronically Signed   By: Charlett Nose M.D.   On: 04/10/2015 20:01   I have personally reviewed and evaluated these images and lab results as part of my medical decision-making.   EKG Interpretation   Date/Time:  Wednesday April 10 2015 19:24:47 EDT Ventricular Rate:  94 PR Interval:  138 QRS Duration: 88 QT Interval:  350 QTC Calculation: 437 R Axis:   26 Text Interpretation:  Normal sinus rhythm Nonspecific T wave abnormality  Abnormal ECG No significant change since last tracing Confirmed by Shaely Gadberry   MD, Pelham Hennick ([id number]) on 04/10/2015 8:44:15 PM      MDM   Final diagnoses:  None    Patient given clonidine 0.1 mg and blood pressure repeated and good response noted. Will be given an additional dose of Norvasc 5 mg by mouth and given prescription for same since this worked for him in the past. Will follow with his primary care doctor    Lorre Nick, MD 04/10/15 2222

## 2015-04-10 NOTE — ED Notes (Signed)
Pt ambulating independently w/ steady gait on d/c in no acute distress, A&Ox4. D/c instructions reviewed w/ pt - pt denies any further questions or concerns at present. Rx given x1  

## 2015-04-10 NOTE — ED Notes (Signed)
Onset today chest pain continued today with dizziness, headache, and hight blood pressure. 220/120. Seen at urgent care stated abnormal EKG sent to ED for evaluation. Left side chest pain currently 8/10 tightness and heavy.

## 2015-04-10 NOTE — Discharge Instructions (Signed)
Hypertension Hypertension, commonly called high blood pressure, is when the force of blood pumping through your arteries is too strong. Your arteries are the blood vessels that carry blood from your heart throughout your body. A blood pressure reading consists of a higher number over a lower number, such as 110/72. The higher number (systolic) is the pressure inside your arteries when your heart pumps. The lower number (diastolic) is the pressure inside your arteries when your heart relaxes. Ideally you want your blood pressure below 120/80. Hypertension forces your heart to work harder to pump blood. Your arteries may become narrow or stiff. Having untreated or uncontrolled hypertension can cause heart attack, stroke, kidney disease, and other problems. RISK FACTORS Some risk factors for high blood pressure are controllable. Others are not.  Risk factors you cannot control include:   Race. You may be at higher risk if you are African American.  Age. Risk increases with age.  Gender. Men are at higher risk than women before age 45 years. After age 65, women are at higher risk than men. Risk factors you can control include:  Not getting enough exercise or physical activity.  Being overweight.  Getting too much fat, sugar, calories, or salt in your diet.  Drinking too much alcohol. SIGNS AND SYMPTOMS Hypertension does not usually cause signs or symptoms. Extremely high blood pressure (hypertensive crisis) may cause headache, anxiety, shortness of breath, and nosebleed. DIAGNOSIS To check if you have hypertension, your health care provider will measure your blood pressure while you are seated, with your arm held at the level of your heart. It should be measured at least twice using the same arm. Certain conditions can cause a difference in blood pressure between your right and left arms. A blood pressure reading that is higher than normal on one occasion does not mean that you need treatment. If  it is not clear whether you have high blood pressure, you may be asked to return on a different day to have your blood pressure checked again. Or, you may be asked to monitor your blood pressure at home for 1 or more weeks. TREATMENT Treating high blood pressure includes making lifestyle changes and possibly taking medicine. Living a healthy lifestyle can help lower high blood pressure. You may need to change some of your habits. Lifestyle changes may include:  Following the DASH diet. This diet is high in fruits, vegetables, and whole grains. It is low in salt, red meat, and added sugars.  Keep your sodium intake below 2,300 mg per day.  Getting at least 30-45 minutes of aerobic exercise at least 4 times per week.  Losing weight if necessary.  Not smoking.  Limiting alcoholic beverages.  Learning ways to reduce stress. Your health care provider may prescribe medicine if lifestyle changes are not enough to get your blood pressure under control, and if one of the following is true:  You are 18-59 years of age and your systolic blood pressure is above 140.  You are 60 years of age or older, and your systolic blood pressure is above 150.  Your diastolic blood pressure is above 90.  You have diabetes, and your systolic blood pressure is over 140 or your diastolic blood pressure is over 90.  You have kidney disease and your blood pressure is above 140/90.  You have heart disease and your blood pressure is above 140/90. Your personal target blood pressure may vary depending on your medical conditions, your age, and other factors. HOME CARE INSTRUCTIONS    Have your blood pressure rechecked as directed by your health care provider.   Take medicines only as directed by your health care provider. Follow the directions carefully. Blood pressure medicines must be taken as prescribed. The medicine does not work as well when you skip doses. Skipping doses also puts you at risk for  problems.  Do not smoke.   Monitor your blood pressure at home as directed by your health care provider. SEEK MEDICAL CARE IF:   You think you are having a reaction to medicines taken.  You have recurrent headaches or feel dizzy.  You have swelling in your ankles.  You have trouble with your vision. SEEK IMMEDIATE MEDICAL CARE IF:  You develop a severe headache or confusion.  You have unusual weakness, numbness, or feel faint.  You have severe chest or abdominal pain.  You vomit repeatedly.  You have trouble breathing. MAKE SURE YOU:   Understand these instructions.  Will watch your condition.  Will get help right away if you are not doing well or get worse.   This information is not intended to replace advice given to you by your health care provider. Make sure you discuss any questions you have with your health care provider.   Document Released: 06/15/2005 Document Revised: 10/30/2014 Document Reviewed: 04/07/2013 Elsevier Interactive Patient Education 2016 [employer] Inc[employer]  

## 2015-04-23 ENCOUNTER — Encounter (HOSPITAL_COMMUNITY): Payer: Self-pay

## 2015-04-23 ENCOUNTER — Emergency Department (HOSPITAL_COMMUNITY)
Admission: EM | Admit: 2015-04-23 | Discharge: 2015-04-23 | Disposition: A | Discharge status: Home / Self Care | Payer: Self-pay | Attending: Emergency Medicine | Admitting: Emergency Medicine

## 2015-04-23 DIAGNOSIS — Y998 Other external cause status: Secondary | ICD-10-CM | POA: Insufficient documentation

## 2015-04-23 DIAGNOSIS — Z79899 Other long term (current) drug therapy: Secondary | ICD-10-CM | POA: Insufficient documentation

## 2015-04-23 DIAGNOSIS — S199XXA Unspecified injury of neck, initial encounter: Secondary | ICD-10-CM | POA: Insufficient documentation

## 2015-04-23 DIAGNOSIS — Z720 Tobacco use: Secondary | ICD-10-CM | POA: Insufficient documentation

## 2015-04-23 DIAGNOSIS — Y9389 Activity, other specified: Secondary | ICD-10-CM | POA: Insufficient documentation

## 2015-04-23 DIAGNOSIS — Y92410 Unspecified street and highway as the place of occurrence of the external cause: Secondary | ICD-10-CM | POA: Insufficient documentation

## 2015-04-23 DIAGNOSIS — Z9861 Coronary angioplasty status: Secondary | ICD-10-CM | POA: Insufficient documentation

## 2015-04-23 DIAGNOSIS — I10 Essential (primary) hypertension: Secondary | ICD-10-CM | POA: Insufficient documentation

## 2015-04-23 DIAGNOSIS — S0990XA Unspecified injury of head, initial encounter: Secondary | ICD-10-CM | POA: Insufficient documentation

## 2015-04-23 HISTORY — DX: Essential (primary) hypertension: I10

## 2015-04-23 MED ORDER — DIAZEPAM 2 MG PO TABS
2.0000 mg | ORAL_TABLET | Freq: Three times a day (TID) | ORAL | Status: AC
Start: 2015-04-23 — End: 2015-04-26

## 2015-04-23 MED ORDER — DIAZEPAM 2 MG PO TABS
2.0000 mg | ORAL_TABLET | Freq: Once | ORAL | Status: AC
  Administered 2015-04-23: 2 mg via ORAL
  Filled 2015-04-23: qty 1

## 2015-04-23 MED ORDER — TRAMADOL HCL 50 MG PO TABS: 50.0000 mg | ORAL_TABLET | Freq: Four times a day (QID) | ORAL | Status: DC | PRN

## 2015-04-23 MED ORDER — IBUPROFEN 200 MG PO TABS: 800.0000 mg | ORAL_TABLET | Freq: Three times a day (TID) | ORAL | Status: AC

## 2015-04-23 MED ORDER — TRAMADOL HCL 50 MG PO TABS
50.0000 mg | ORAL_TABLET | Freq: Once | ORAL | Status: AC
  Administered 2015-04-23: 50 mg via ORAL
  Filled 2015-04-23: qty 1
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
## 2015-06-24 ENCOUNTER — Encounter (HOSPITAL_COMMUNITY): Payer: Self-pay | Admitting: *Deleted

## 2015-06-24 ENCOUNTER — Emergency Department (HOSPITAL_COMMUNITY)
Admission: EM | Admit: 2015-06-24 | Discharge: 2015-06-24 | Disposition: A | Discharge status: Home / Self Care | Payer: Self-pay | Attending: Emergency Medicine | Admitting: Emergency Medicine

## 2015-06-24 ENCOUNTER — Emergency Department (HOSPITAL_COMMUNITY): Payer: Self-pay

## 2015-06-24 DIAGNOSIS — I10 Essential (primary) hypertension: Secondary | ICD-10-CM | POA: Insufficient documentation

## 2015-06-24 DIAGNOSIS — Z79899 Other long term (current) drug therapy: Secondary | ICD-10-CM | POA: Insufficient documentation

## 2015-06-24 DIAGNOSIS — Y998 Other external cause status: Secondary | ICD-10-CM | POA: Insufficient documentation

## 2015-06-24 DIAGNOSIS — S8391XA Sprain of unspecified site of right knee, initial encounter: Secondary | ICD-10-CM

## 2015-06-24 DIAGNOSIS — F17200 Nicotine dependence, unspecified, uncomplicated: Secondary | ICD-10-CM | POA: Insufficient documentation

## 2015-06-24 DIAGNOSIS — Y92410 Unspecified street and highway as the place of occurrence of the external cause: Secondary | ICD-10-CM | POA: Insufficient documentation

## 2015-06-24 DIAGNOSIS — Y9389 Activity, other specified: Secondary | ICD-10-CM | POA: Insufficient documentation

## 2015-06-24 MED ORDER — AMLODIPINE BESYLATE 5 MG PO TABS: 5.0000 mg | ORAL_TABLET | Freq: Every day | ORAL | Status: DC
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
## 2015-06-24 NOTE — ED Notes (Signed)
MD at bedside. 

## 2016-03-23 ENCOUNTER — Encounter: Payer: Self-pay | Admitting: Family Medicine

## 2016-03-23 ENCOUNTER — Ambulatory Visit (INDEPENDENT_AMBULATORY_CARE_PROVIDER_SITE_OTHER): Payer: Self-pay | Admitting: Family Medicine

## 2016-03-23 VITALS — BP 180/120 | HR 98 | Resp 18 | Ht 71.0 in | Wt 218.0 lb

## 2016-03-23 DIAGNOSIS — Z720 Tobacco use: Secondary | ICD-10-CM

## 2016-03-23 DIAGNOSIS — Z7189 Other specified counseling: Secondary | ICD-10-CM

## 2016-03-23 DIAGNOSIS — I10 Essential (primary) hypertension: Secondary | ICD-10-CM | POA: Insufficient documentation

## 2016-03-23 DIAGNOSIS — Z7689 Persons encountering health services in other specified circumstances: Secondary | ICD-10-CM

## 2016-03-23 DIAGNOSIS — Z23 Encounter for immunization: Secondary | ICD-10-CM

## 2016-03-23 DIAGNOSIS — E785 Hyperlipidemia, unspecified: Secondary | ICD-10-CM | POA: Insufficient documentation

## 2016-03-23 MED ORDER — AMLODIPINE BESYLATE 10 MG PO TABS: 10.0000 mg | ORAL_TABLET | Freq: Every day | ORAL | 3 refills | Status: DC

## 2016-03-23 NOTE — Patient Instructions (Signed)
Fill and start the BP med today  Return for a PE and EKG Need also a chest x ray at your convenience Due for fasting lab testing  Flu shot today  Return in one week in the morning for a nurse visit to check your BP

## 2016-03-23 NOTE — Progress Notes (Signed)
Chief Complaint  Patient presents with  . Establish Care    no previous pcp    40 year old gentleman here to establish as a new patient with this clinic. He has had a lapse in his medical care and is not currently on his blood pressure medication. He has done this a number of times in the past shows noncompliance with starting on blood pressure medication. His blood pressure is very high today. His blood pressure has been very high the last couple emergency room and urgent care visits. He feels well. No headache no dizzy spells. No nausea. We discussed the importance of staying on blood pressure medication. We discussed that hypertension is a "silent killer". We discussed that he is at increased risk for heart attack and stroke. He is put back on his medication today and promises to come back in a week for blood pressure check. No known complications from his hypertension. He was worked up for chest pain in 2012 and had a cardiac catheterization. It was normal. It was presumed he had vasospasm causing his chest pain. Old records review use hyperlipidemia. He has never been told he has adverse cholesterol and that he should watch the cholesterol in his diet. This is discussed today. Since his old panel is more than 39 years old I will get a new lipid panel before deciding upon a statin. The patient smokes cigarettes.I have discussed the multiple health risks associated with cigarette smoking including, but not limited to, cardiovascular disease, lung disease and cancer.  I have strongly recommended that smoking be stopped.  I have reviewed the various methods of quitting including cold [place], classes, nicotine replacements and prescription medications.  I have offered assistance in this difficult process.  The patient is not interested in assistance at this time. He does feel like he can quit smoking on his own. The patient is not on any particular diet. He does not get regular exercise. The importance  of exercising 150 minutes a week is discussed with him.    Patient Active Problem List   Diagnosis Date Noted  . Essential hypertension 03/23/2016  . Uncontrolled hypertension 03/23/2016  . Tobacco abuse 03/23/2016  . HLD (hyperlipidemia) 03/23/2016    Outpatient Encounter Prescriptions as of 03/23/2016  Medication Sig  . Multiple Vitamin (MULTIVITAMIN WITH MINERALS) TABS tablet Take 1 tablet by mouth daily.  Marland Kitchen. amLODipine (NORVASC) 10 MG tablet Take 1 tablet (10 mg total) by mouth daily.   No facility-administered encounter medications on file as of 03/23/2016.     Past Medical History:  Diagnosis Date  . Chest pain   . Heart murmur    AS A CHILD  . Hyperlipidemia   . Hypertension     History reviewed. No pertinent surgical history.  Social History   Social History  . Marital status: Married    Spouse name: N/A  . Number of children: 0  . 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 Smoking status: Current Some Day Smoker    Packs/day: 0.50  . Smokeless tobacco: Never Used  . Alcohol use No  . Drug use: No  . Sexual activity: Yes   Other Topics Concern  . Not on file   Social History Narrative   Lives with wife. Married 14 years. No children    Family History  Problem Relation Age of Onset  . Heart disease Mother   .  Hypertension Mother   . Heart disease Father   . Hypertension Father   . Hypertension Sister   . Hypertension Brother   . Cancer Maternal Uncle     bone    Review of Systems  Constitutional: Negative for chills, fever and weight loss.  HENT: Negative for congestion and hearing loss.   Eyes: Negative for blurred vision and pain.  Respiratory: Negative for cough and shortness of breath.   Cardiovascular: Negative for chest pain and leg swelling.  Gastrointestinal: Negative for abdominal pain, constipation, diarrhea and heartburn.  Genitourinary: Negative for  dysuria and frequency.  Musculoskeletal: Negative for falls, joint pain and myalgias.  Neurological: Negative for dizziness, seizures and headaches.  Psychiatric/Behavioral: Negative for depression. The patient is not nervous/anxious and does not have insomnia.     BP (!) 180/120   Pulse 98   Resp 18   Ht 5\' 11"  (1.803 m)   Wt 218 lb (98.9 kg)   SpO2 100%   BMI 30.40 kg/m   Physical Exam  Constitutional: He is oriented to person, place, and time. He appears well-developed and well-nourished.  HENT:  Head: Normocephalic and atraumatic.  Right Ear: External ear normal.  Left Ear: External ear normal.  Mouth/Throat: Oropharynx is clear and moist.  Eyes: Conjunctivae are normal. Pupils are equal, round, and reactive to light.  Fundi AV nicking  Neck: Normal range of motion. Neck supple. No thyromegaly present.  Cardiovascular: Normal rate, regular rhythm and normal heart sounds.   Pulmonary/Chest: Effort normal and breath sounds normal. No respiratory distress.  Abdominal: Soft. Bowel sounds are normal.  Musculoskeletal: Normal range of motion. He exhibits no edema.  Lymphadenopathy:    He has no cervical adenopathy.  Neurological: He is alert and oriented to person, place, and time.  Gait normal  Skin: Skin is warm and dry.  Psychiatric: He has a normal mood and affect. His behavior is normal. Thought content normal.  Nursing note and vitals reviewed.  ASSESSMENT/PLAN:  1. Hypertension, uncontrolled  - CBC with Differential/Platelet - COMPLETE METABOLIC PANEL WITH GFR - Lipid panel - DG Chest 2 View; Future  2. Encounter for immunization  - Flu Vaccine QUAD 36+ mos IM  3. Essential hypertension   4. Tobacco abuse   5. HLD (hyperlipidemia)   6. Encounter to establish care with new doctor    Patient Instructions  Fill and start the BP med today  Return for a PE and EKG Need also a chest x ray at your convenience Due for fasting lab testing  Flu shot  today  Return in one week in the morning for a nurse visit to check your BP       [name], MD

## 2016-03-27 ENCOUNTER — Telehealth: Payer: Self-pay

## 2016-03-27 NOTE — Telephone Encounter (Signed)
Called and left message for patient to return call.  

## 2016-03-30 ENCOUNTER — Ambulatory Visit: Payer: Self-pay

## 2016-03-30 VITALS — BP 178/116

## 2016-03-30 DIAGNOSIS — I10 Essential (primary) hypertension: Secondary | ICD-10-CM

## 2016-03-30 MED ORDER — LOSARTAN POTASSIUM-HCTZ 50-12.5 MG PO TABS: 1.0000 | ORAL_TABLET | Freq: Every day | ORAL | 1 refills | Status: DC

## 2016-03-30 NOTE — Patient Instructions (Signed)
Add the losartan Take once a day in the morning Need BP check in a week

## 2016-03-31 NOTE — Progress Notes (Signed)
BP still elevated Ad losartan/hctz Repeat in one week

## 2016-04-06 ENCOUNTER — Ambulatory Visit: Payer: Self-pay

## 2016-04-06 VITALS — BP 156/104

## 2016-04-06 DIAGNOSIS — I10 Essential (primary) hypertension: Secondary | ICD-10-CM

## 2016-04-06 NOTE — Progress Notes (Signed)
Patient here for B/P check.  Vitals:   04/06/16 1053 04/06/16 1054 04/06/16 1105  BP: (!) 174/96 (!) 166/100 (!) 156/104    Patient instructed to increase Losartan-HCTZ 50-12.5 mg tablet BID per Dr. Delton SeeNelson.  Continue Amlodopine 10mg  tablet QD. Encouraged to keep physcial appointment scheduled for 04/22/16. Patient agreed and verbalized understanding.

## 2016-04-10 ENCOUNTER — Telehealth: Payer: Self-pay

## 2016-04-10 NOTE — Telephone Encounter (Signed)
Called patient and advised that he go to ED if symptoms worsen.

## 2016-04-22 ENCOUNTER — Other Ambulatory Visit: Payer: Self-pay

## 2016-04-22 ENCOUNTER — Ambulatory Visit (INDEPENDENT_AMBULATORY_CARE_PROVIDER_SITE_OTHER): Payer: Self-pay | Admitting: Family Medicine

## 2016-04-22 ENCOUNTER — Encounter: Payer: Self-pay | Admitting: Family Medicine

## 2016-04-22 VITALS — BP 156/110 | HR 108 | Temp 98.7°F | Resp 16 | Ht 70.0 in | Wt 219.1 lb

## 2016-04-22 DIAGNOSIS — Z0000 Encounter for general adult medical examination without abnormal findings: Secondary | ICD-10-CM

## 2016-04-22 DIAGNOSIS — Z720 Tobacco use: Secondary | ICD-10-CM

## 2016-04-22 DIAGNOSIS — E785 Hyperlipidemia, unspecified: Secondary | ICD-10-CM

## 2016-04-22 DIAGNOSIS — R9431 Abnormal electrocardiogram [ECG] [EKG]: Secondary | ICD-10-CM

## 2016-04-22 DIAGNOSIS — I10 Essential (primary) hypertension: Secondary | ICD-10-CM

## 2016-04-22 DIAGNOSIS — Z23 Encounter for immunization: Secondary | ICD-10-CM

## 2016-04-22 MED ORDER — LOSARTAN POTASSIUM-HCTZ 50-12.5 MG PO TABS: 1.0000 | ORAL_TABLET | Freq: Two times a day (BID) | ORAL | 1 refills | Status: DC

## 2016-04-22 MED ORDER — METOPROLOL SUCCINATE ER 50 MG PO TB24: 50.0000 mg | ORAL_TABLET | Freq: Every day | ORAL | 1 refills | Status: DC

## 2016-04-22 NOTE — Patient Instructions (Addendum)
Blood pressure: Take the losartan 50 twice a day Take the amlodipine 10 mg once a day Take the metoprolol once a day.  This is a new medicine.  It will slow down the heart rate to reduce BP and strain on the heart. The DASH eating plan is recommended for you  Abnormal EKG: I have placed a referral to cardiology  Screen for cholesterol:  Need lab tests, get these today if you are able  Smoking:  QUIT, or cut back as much as you are able  See me in 2 months  Walk every day that you are able.Marland Kitchen.   DASH Eating Plan DASH stands for "Dietary Approaches to Stop Hypertension." The DASH eating plan is a healthy eating plan that has been shown to reduce high blood pressure (hypertension). Additional health benefits may include reducing the risk of type 2 diabetes mellitus, heart disease, and stroke. The DASH eating plan may also help with weight loss. WHAT DO I NEED TO KNOW ABOUT THE DASH EATING PLAN? For the DASH eating plan, you will follow these general guidelines:  Choose foods with a percent daily value for sodium of less than 5% (as listed on the food label).  Use salt-free seasonings or herbs instead of table salt or sea salt.  Check with your health care provider or pharmacist before using salt substitutes.  Eat lower-sodium products, often labeled as "lower sodium" or "no salt added."  Eat fresh foods.  Eat more vegetables, fruits, and low-fat dairy products.  Choose whole grains. Look for the word "whole" as the first word in the ingredient list.  Choose fish and skinless chicken or [place] more often than red meat. Limit fish, poultry, and meat to 6 oz (170 g) each day.  Limit sweets, desserts, sugars, and sugary drinks.  Choose heart-healthy fats.  Limit cheese to 1 oz (28 g) per day.  Eat more home-cooked food and less restaurant, buffet, and fast food.  Limit fried foods.  Cook foods using methods other than frying.  Limit canned vegetables. If you do use them, rinse  them well to decrease the sodium.  When eating at a restaurant, ask that your food be prepared with less salt, or no salt if possible. WHAT FOODS CAN I EAT? Seek help from a dietitian for individual calorie needs. Grains Whole grain or whole wheat bread. Brown rice. Whole grain or whole wheat pasta. Quinoa, bulgur, and whole grain cereals. Low-sodium cereals. Corn or whole wheat flour tortillas. Whole grain cornbread. Whole grain crackers. Low-sodium crackers. Vegetables Fresh or frozen vegetables (raw, steamed, roasted, or grilled). Low-sodium or reduced-sodium tomato and vegetable juices. Low-sodium or reduced-sodium tomato sauce and paste. Low-sodium or reduced-sodium canned vegetables.  Fruits All fresh, canned (in natural juice), or frozen fruits. Meat and Other Protein Products Ground beef (85% or leaner), grass-fed beef, or beef trimmed of fat. Skinless chicken or [place]. Ground chicken or [place]. Pork trimmed of fat. All fish and seafood. Eggs. Dried beans, peas, or lentils. Unsalted nuts and seeds. Unsalted canned beans. Dairy Low-fat dairy products, such as skim or 1% milk, 2% or reduced-fat cheeses, low-fat ricotta or cottage cheese, or plain low-fat yogurt. Low-sodium or reduced-sodium cheeses. Fats and Oils Tub margarines without trans fats. Light or reduced-fat mayonnaise and salad dressings (reduced sodium). Avocado. Safflower, olive, or canola oils. Natural peanut or almond butter. Other Unsalted popcorn and pretzels. The items listed above may not be a complete list of recommended foods or beverages. Contact your dietitian for more  options. WHAT FOODS ARE NOT RECOMMENDED? Grains White bread. White pasta. White rice. Refined cornbread. Bagels and croissants. Crackers that contain trans fat. Vegetables Creamed or fried vegetables. Vegetables in a cheese sauce. Regular canned vegetables. Regular canned tomato sauce and paste. Regular tomato and vegetable juices. Fruits Dried  fruits. Canned fruit in light or heavy syrup. Fruit juice. Meat and Other Protein Products Fatty cuts of meat. Ribs, chicken wings, bacon, sausage, bologna, salami, chitterlings, fatback, hot dogs, bratwurst, and packaged luncheon meats. Salted nuts and seeds. Canned beans with salt. Dairy Whole or 2% milk, cream, half-and-half, and cream cheese. Whole-fat or sweetened yogurt. Full-fat cheeses or blue cheese. Nondairy creamers and whipped toppings. Processed cheese, cheese spreads, or cheese curds. Condiments Onion and garlic salt, seasoned salt, table salt, and sea salt. Canned and packaged gravies. Worcestershire sauce. Tartar sauce. Barbecue sauce. Teriyaki sauce. Soy sauce, including reduced sodium. Steak sauce. Fish sauce. Oyster sauce. Cocktail sauce. Horseradish. Ketchup and mustard. Meat flavorings and tenderizers. Bouillon cubes. Hot sauce. Tabasco sauce. Marinades. Taco seasonings. Relishes. Fats and Oils Butter, stick margarine, lard, shortening, ghee, and bacon fat. Coconut, palm kernel, or palm oils. Regular salad dressings. Other Pickles and olives. Salted popcorn and pretzels. The items listed above may not be a complete list of foods and beverages to avoid. Contact your dietitian for more information. WHERE CAN I FIND MORE INFORMATION? National Heart, Lung, and Blood Institute: travelstabloid.com   This information is not intended to replace advice given to you by your health care provider. Make sure you discuss any questions you have with your health care provider.   Document Released: 06/04/2011 Document Revised: 07/06/2014 Document Reviewed: 04/19/2013 Elsevier Interactive Patient Education [employer].

## 2016-04-22 NOTE — Progress Notes (Signed)
Chief Complaint  Patient presents with  . Annual Exam   Patient is here for an annual physical examination. He has had not had an annual physical for many years. He states in general he is feeling well. He has some muscle tension in his upper back that he like to discuss. 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 He has uncontrolled hypertension. He was initially started on amlodipine. He is on a max dose at 10 mg a day. He was then given losartan/hydrochlorothiazide. This was then doubled. He is now taking 100 mg daily losartan and 25 mg of hydrochlorothiazide. At this visit I'm adding metoprolol 25 mg a day. He has risk factors for cardiovascular disease. He has hypertension, tobacco abuse, and a history of hyperlipidemia. He doesn't have any chest pain shortness of breath or symptoms of heart disease. At this visit I did an EKG. It is abnormal he will be referred to cardiology for consultation. He got a flu shot at his last visit. He is getting a TDap today. He is otherwise healthy, although she is 2 did review a heart healthy diet and regular exercise recommendations.    Patient Active Problem List   Diagnosis Date Noted  . Essential hypertension 03/23/2016  . Uncontrolled hypertension 03/23/2016  . Tobacco abuse 03/23/2016  . HLD (hyperlipidemia) 03/23/2016    Outpatient Encounter Prescriptions as of 04/22/2016  Medication Sig  . amLODipine (NORVASC) 10 MG tablet Take 1 tablet (10 mg total) by mouth daily.  Marland Kitchen losartan-hydrochlorothiazide (HYZAAR) 50-12.5 MG tablet Take 1 tablet by mouth 2 (two) times daily.  . metoprolol succinate (TOPROL-XL) 50 MG 24 hr tablet Take 1 tablet (50 mg total) by mouth daily. Take with or immediately following a meal.  . Multiple Vitamin (MULTIVITAMIN WITH MINERALS) TABS tablet Take 1 tablet by mouth daily.   No facility-administered  encounter medications on file as of 04/22/2016.     Allergies  Allergen Reactions  . Toradol [Ketorolac Tromethamine] Hives    Review of Systems  Constitutional: Negative.  Negative for fatigue and fever.  HENT: Negative.  Negative for postnasal drip and rhinorrhea.   Eyes: Negative.  Negative for visual disturbance.  Respiratory: Negative.  Negative for cough and stridor.   Cardiovascular: Negative for chest pain, palpitations and leg swelling.  Gastrointestinal: Negative for constipation and diarrhea.  Genitourinary: Negative for difficulty urinating and frequency.  Musculoskeletal: Positive for neck stiffness. Negative for arthralgias and back pain.  Neurological: Negative for dizziness and headaches.  Psychiatric/Behavioral: Negative.  Negative for dysphoric mood and self-injury. The patient is not nervous/anxious.     BP (!) 156/110 (BP Location: Left Arm, Patient Position: Sitting, Cuff Size: Large)   Pulse (!) 108   Temp 98.7 F (37.1 C) (Oral)   Resp 16   Ht 5\' 10"  (1.778 m)   Wt 219 lb 1.9 oz (99.4 kg)   SpO2 99%   BMI 31.44 kg/m   Physical Exam BP (!) 156/110 (BP Location: Left Arm, Patient Position: Sitting, Cuff Size: Large)   Pulse (!) 108   Temp 98.7 F (37.1 C) (Oral)   Resp 16   Ht 5\' 10"  (1.778 m)   Wt 219 lb 1.9 oz (99.4 kg)   SpO2 99%   BMI 31.44 kg/m   General Appearance:    Alert, cooperative, no distress, appears stated age  Head:    Normocephalic, without obvious abnormality, atraumatic  Eyes:    PERRL, conjunctiva/corneas clear, EOM's intact, fundi    With early av nicking and copper wire changes, both eyes       Ears:    Normal TM's and external ear canals, both ears  Nose:   Nares normal, septum midline, mucosa normal, no drainage   or sinus tenderness  Throat:   Lips, mucosa, and tongue normal; teeth and gums normal  Neck:   Supple, symmetrical, trachea midline, no adenopathy;       thyroid:  No enlargement/tenderness/nodules; no  carotid   bruit   Back:     Symmetric, no curvature, ROM normal, no CVA tenderness  Lungs:     Clear to auscultation bilaterally, respirations unlabored  Chest wall:    No tenderness or deformity  Heart:    Regular rate and rhythm mild tachycardia, S1 and S2 normal, no murmur, rub   or gallop  Abdomen:     Soft, non-tender, bowel sounds active all four quadrants,    no masses, no organomegaly  Genitalia:    Normal male without lesion, discharge or tenderness  Extremities:   Extremities normal, atraumatic, no cyanosis or edema  Pulses:   2+ and symmetric all extremities  Skin:   Skin color, texture, turgor normal, no rashes or lesions  Lymph nodes:   Cervical, supraclavicular, and axillary nodes normal  Neurologic:   Normal strength, sensation and reflexes      throughout    ASSESSMENT/PLAN:  1. Uncontrolled hypertension Add metoprolol. Confirmed compliance with current medications. - EKG 12-Lead - Ambulatory referral to Cardiology  2. Tobacco abuse Counseled again regarding tobacco cessation  3. Hyperlipidemia, unspecified hyperlipidemia type Check lipid panel  4. Nonspecific abnormal electrocardiogram (ECG) (EKG) Discussed with patient. Asymptomatic. - Ambulatory referral to Cardiology  5. PE (physical exam), annual Immunization up-to-date   Patient Instructions  Blood pressure: Take the losartan 50 twice a day Take the amlodipine 10 mg once a day Take the metoprolol once a day.  This is a new medicine.  It will slow down the heart rate to reduce BP and strain on the heart. The DASH eating plan is recommended for you  Abnormal EKG: I have placed a referral to cardiology  Screen for cholesterol:  Need lab tests, get these today if you are able  Smoking:  QUIT, or cut back as much as you are able  See me in 2 months  Walk every day that you are able.Marland Kitchen.   DASH Eating Plan DASH stands for "Dietary Approaches to Stop Hypertension." The DASH eating plan is a healthy  eating plan that has been shown to reduce high blood pressure (hypertension). Additional health benefits may include reducing the risk of type 2 diabetes mellitus, heart disease, and stroke. The DASH eating plan may also help with weight loss. WHAT DO I NEED TO KNOW ABOUT THE DASH EATING PLAN? For the DASH eating plan, you will follow these general guidelines:  Choose foods with a percent daily value for sodium of less than 5% (as listed on the food label).  Use salt-free seasonings or herbs instead of table salt or sea salt.  Check with your health care provider or pharmacist before using salt substitutes.  Eat lower-sodium products, often labeled as "lower sodium" or "no salt added."  Eat fresh foods.  Eat more vegetables, fruits, and low-fat dairy products.  Choose whole grains. Look for the word "whole" as the first word  in the ingredient list.  Choose fish and skinless chicken or [place] more often than red meat. Limit fish, poultry, and meat to 6 oz (170 g) each day.  Limit sweets, desserts, sugars, and sugary drinks.  Choose heart-healthy fats.  Limit cheese to 1 oz (28 g) per day.  Eat more home-cooked food and less restaurant, buffet, and fast food.  Limit fried foods.  Cook foods using methods other than frying.  Limit canned vegetables. If you do use them, rinse them well to decrease the sodium.  When eating at a restaurant, ask that your food be prepared with less salt, or no salt if possible. WHAT FOODS CAN I EAT? Seek help from a dietitian for individual calorie needs. Grains Whole grain or whole wheat bread. Brown rice. Whole grain or whole wheat pasta. Quinoa, bulgur, and whole grain cereals. Low-sodium cereals. Corn or whole wheat flour tortillas. Whole grain cornbread. Whole grain crackers. Low-sodium crackers. Vegetables Fresh or frozen vegetables (raw, steamed, roasted, or grilled). Low-sodium or reduced-sodium tomato and vegetable juices. Low-sodium or  reduced-sodium tomato sauce and paste. Low-sodium or reduced-sodium canned vegetables.  Fruits All fresh, canned (in natural juice), or frozen fruits. Meat and Other Protein Products Ground beef (85% or leaner), grass-fed beef, or beef trimmed of fat. Skinless chicken or [place]. Ground chicken or [place]. Pork trimmed of fat. All fish and seafood. Eggs. Dried beans, peas, or lentils. Unsalted nuts and seeds. Unsalted canned beans. Dairy Low-fat dairy products, such as skim or 1% milk, 2% or reduced-fat cheeses, low-fat ricotta or cottage cheese, or plain low-fat yogurt. Low-sodium or reduced-sodium cheeses. Fats and Oils Tub margarines without trans fats. Light or reduced-fat mayonnaise and salad dressings (reduced sodium). Avocado. Safflower, olive, or canola oils. Natural peanut or almond butter. Other Unsalted popcorn and pretzels. The items listed above may not be a complete list of recommended foods or beverages. Contact your dietitian for more options. WHAT FOODS ARE NOT RECOMMENDED? Grains White bread. White pasta. White rice. Refined cornbread. Bagels and croissants. Crackers that contain trans fat. Vegetables Creamed or fried vegetables. Vegetables in a cheese sauce. Regular canned vegetables. Regular canned tomato sauce and paste. Regular tomato and vegetable juices. Fruits Dried fruits. Canned fruit in light or heavy syrup. Fruit juice. Meat and Other Protein Products Fatty cuts of meat. Ribs, chicken wings, bacon, sausage, bologna, salami, chitterlings, fatback, hot dogs, bratwurst, and packaged luncheon meats. Salted nuts and seeds. Canned beans with salt. Dairy Whole or 2% milk, cream, half-and-half, and cream cheese. Whole-fat or sweetened yogurt. Full-fat cheeses or blue cheese. Nondairy creamers and whipped toppings. Processed cheese, cheese spreads, or cheese curds. Condiments Onion and garlic salt, seasoned salt, table salt, and sea salt. Canned and packaged gravies.  Worcestershire sauce. Tartar sauce. Barbecue sauce. Teriyaki sauce. Soy sauce, including reduced sodium. Steak sauce. Fish sauce. Oyster sauce. Cocktail sauce. Horseradish. Ketchup and mustard. Meat flavorings and tenderizers. Bouillon cubes. Hot sauce. Tabasco sauce. Marinades. Taco seasonings. Relishes. Fats and Oils Butter, stick margarine, lard, shortening, ghee, and bacon fat. Coconut, palm kernel, or palm oils. Regular salad dressings. Other Pickles and olives. Salted popcorn and pretzels. The items listed above may not be a complete list of foods and beverages to avoid. Contact your dietitian for more information. WHERE CAN I FIND MORE INFORMATION? National Heart, Lung, and Blood Institute: [employer]Prom[employer].it   This information is not intended to replace advice given to you by your health care provider. Make sure you discuss any questions you have with your  health care provider.   Document Released: 06/04/2011 Document Revised: 07/06/2014 Document Reviewed: 04/19/2013 Elsevier Interactive Patient Education 2016 Elsevier Inc.    [name], MD

## 2016-05-05 ENCOUNTER — Encounter: Payer: Self-pay | Admitting: Cardiology

## 2016-05-06 ENCOUNTER — Encounter (HOSPITAL_COMMUNITY): Payer: Self-pay | Admitting: Emergency Medicine

## 2016-05-06 ENCOUNTER — Emergency Department (HOSPITAL_COMMUNITY)
Admission: EM | Admit: 2016-05-06 | Discharge: 2016-05-06 | Disposition: A | Discharge status: Home / Self Care | Payer: No Typology Code available for payment source | Attending: Emergency Medicine | Admitting: Emergency Medicine

## 2016-05-06 DIAGNOSIS — Z79899 Other long term (current) drug therapy: Secondary | ICD-10-CM | POA: Insufficient documentation

## 2016-05-06 DIAGNOSIS — Y92410 Unspecified street and highway as the place of occurrence of the external cause: Secondary | ICD-10-CM | POA: Insufficient documentation

## 2016-05-06 DIAGNOSIS — I10 Essential (primary) hypertension: Secondary | ICD-10-CM | POA: Insufficient documentation

## 2016-05-06 DIAGNOSIS — F17200 Nicotine dependence, unspecified, uncomplicated: Secondary | ICD-10-CM | POA: Diagnosis not present

## 2016-05-06 DIAGNOSIS — S161XXA Strain of muscle, fascia and tendon at neck level, initial encounter: Secondary | ICD-10-CM | POA: Diagnosis not present

## 2016-05-06 DIAGNOSIS — Y939 Activity, unspecified: Secondary | ICD-10-CM | POA: Diagnosis not present

## 2016-05-06 DIAGNOSIS — Y999 Unspecified external cause status: Secondary | ICD-10-CM | POA: Diagnosis not present

## 2016-05-06 DIAGNOSIS — S199XXA Unspecified injury of neck, initial encounter: Secondary | ICD-10-CM | POA: Diagnosis present

## 2016-05-06 DIAGNOSIS — T148XXA Other injury of unspecified body region, initial encounter: Secondary | ICD-10-CM

## 2016-05-06 MED ORDER — DICLOFENAC SODIUM 1 % TD GEL: 2.0000 g | Freq: Four times a day (QID) | TRANSDERMAL | 0 refills | Status: DC

## 2016-05-06 MED ORDER — CYCLOBENZAPRINE HCL 10 MG PO TABS: 10.0000 mg | ORAL_TABLET | Freq: Two times a day (BID) | ORAL | 0 refills | Status: DC | PRN

## 2016-05-06 MED ORDER — IBUPROFEN 800 MG PO TABS: 800.0000 mg | ORAL_TABLET | Freq: Three times a day (TID) | ORAL | 0 refills | Status: DC

## 2016-05-06 NOTE — ED Notes (Signed)
Pt ambulated to the BR with steady gait.   

## 2016-05-06 NOTE — Discharge Instructions (Signed)
Apply ice to affected area. Increase your mobility. Take ibuprofen and Flexeril as needed for pain and inflammation. Follow up with your primary care provider if your symptoms do not improve. Return to the ED if you experience loss of consciousness, blurry vision, vomiting, loss of control of your bowel and bladder, numbness or tingling in your lower extremities.
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
## 2016-05-08 ENCOUNTER — Ambulatory Visit (INDEPENDENT_AMBULATORY_CARE_PROVIDER_SITE_OTHER): Payer: Self-pay | Admitting: Cardiology

## 2016-05-08 ENCOUNTER — Ambulatory Visit: Payer: Self-pay | Admitting: Cardiology

## 2016-05-08 ENCOUNTER — Encounter: Payer: Self-pay | Admitting: Cardiology

## 2016-05-08 DIAGNOSIS — R0601 Orthopnea: Secondary | ICD-10-CM | POA: Insufficient documentation

## 2016-05-08 DIAGNOSIS — Z720 Tobacco use: Secondary | ICD-10-CM

## 2016-05-08 DIAGNOSIS — I10 Essential (primary) hypertension: Secondary | ICD-10-CM | POA: Insufficient documentation

## 2016-05-08 MED ORDER — METOPROLOL SUCCINATE ER 100 MG PO TB24
100.0000 mg | ORAL_TABLET | Freq: Every day | ORAL | 6 refills | Status: DC
Start: 2016-05-08 — End: 2016-07-22
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
## 2016-05-08 NOTE — Patient Instructions (Signed)
MEDICATION INCREASE METOPROLOL SUCCINATE 100 MG ONE TABLET BY MOUTH  DAILY   SCHEDULE AT [address] Your physician has requested that you have a renal artery duplex. During this test, an ultrasound is used to evaluate blood flow to the kidneys. Allow one hour for this exam. Do not eat after midnight the day before and avoid carbonated beverages. Take your medications as you usually do.   SCHEDULE AT [address] Your physician has requested that you have an echocardiogram. Echocardiography is a painless test that uses sound waves to create images of your heart. It provides your doctor with information about the size and shape of your heart and how well your heart's chambers and valves are working. This procedure takes approximately one hour. There are no restrictions for this procedure.   Your physician recommends that you schedule a follow-up appointment in: 6-8 WEEKS WITH DR HARDING F/U TEST RESULTS

## 2016-05-10 ENCOUNTER — Encounter: Payer: Self-pay | Admitting: Cardiology

## 2016-05-10 NOTE — Assessment & Plan Note (Signed)
We talked briefly about smoking cessation, but that was not the main focus of the visit today.

## 2016-05-10 NOTE — Assessment & Plan Note (Addendum)
Blood pressure looks a lot better today, but diastolic pressure still at 100. Also note is that his heart rate is 98. Looking for secondary causes for hypertension, labs would not suggest hyperaldosteronism because potassium and sodium levels are stable. His TSH is been evaluated.  Plan: Check Renal Artery Dopplers   Increase metoprolol succinate to 100 mg daily.  S4 heard on exam, and long-standing hypertension - check 2-D echocardiogram to assess for any hypertensive heart disease.

## 2016-05-29 ENCOUNTER — Ambulatory Visit (HOSPITAL_COMMUNITY)
Admission: RE | Admit: 2016-05-29 | Discharge: 2016-05-29 | Disposition: A | Discharge status: Home / Self Care | Payer: Self-pay | Source: Ambulatory Visit | Attending: Internal Medicine | Admitting: Internal Medicine

## 2016-05-29 DIAGNOSIS — I10 Essential (primary) hypertension: Secondary | ICD-10-CM

## 2016-05-29 DIAGNOSIS — R0601 Orthopnea: Secondary | ICD-10-CM | POA: Insufficient documentation

## 2016-06-02 ENCOUNTER — Ambulatory Visit (HOSPITAL_COMMUNITY): Payer: Self-pay | Attending: Cardiology

## 2016-06-02 ENCOUNTER — Other Ambulatory Visit: Payer: Self-pay

## 2016-06-02 DIAGNOSIS — R0601 Orthopnea: Secondary | ICD-10-CM

## 2016-06-02 DIAGNOSIS — I10 Essential (primary) hypertension: Secondary | ICD-10-CM

## 2016-06-03 NOTE — Progress Notes (Signed)
Echo results: Good news: Essentially normal echocardiogram and normal pump function and normal valve function.  EF: 55-60%.(this is the normal range) No regional wall motion abnormalities = No signs to suggest heart attack.Bryan Lemma.  Mikle Sternberg, MD

## 2016-06-05 ENCOUNTER — Telehealth: Payer: Self-pay | Admitting: *Deleted

## 2016-06-05 NOTE — Telephone Encounter (Signed)
-----   Message from Marykay Lexavid W Harding, MD sent at 06/03/2016  6:13 PM EST ----- Echo results: Good news: Essentially normal echocardiogram and normal pump function and normal valve function.  EF: 55-60%.(this is the normal range) No regional wall motion abnormalities = No signs to suggest heart attack.Bryan Lemma.  David Harding, MD

## 2016-06-05 NOTE — Telephone Encounter (Signed)
NO ANSWER LEFT MESSAGE ON VOICEMAIL TO CALL ON MONDAY

## 2016-06-05 NOTE — Telephone Encounter (Signed)
LEFT MESSAGE TO CALL BACK - IN REGARDS TO RESULT OF ECHO. [facility]

## 2016-06-05 NOTE — Telephone Encounter (Signed)
Pt returning this office call please give him a call back with ECHO results.

## 2016-06-08 NOTE — Telephone Encounter (Signed)
[facility]

## 2016-06-11 ENCOUNTER — Telehealth: Payer: Self-pay | Admitting: Family Medicine

## 2016-06-11 NOTE — Telephone Encounter (Signed)
Dr. Delton SeeNelson PAL tried to reschedule

## 2016-06-15 ENCOUNTER — Ambulatory Visit: Payer: Self-pay | Admitting: Family Medicine

## 2016-07-14 ENCOUNTER — Ambulatory Visit (INDEPENDENT_AMBULATORY_CARE_PROVIDER_SITE_OTHER): Payer: 59 | Admitting: Cardiology

## 2016-07-14 ENCOUNTER — Encounter: Payer: Self-pay | Admitting: Cardiology

## 2016-07-14 VITALS — BP 141/90 | HR 105 | Ht 71.0 in | Wt 228.6 lb

## 2016-07-14 DIAGNOSIS — E782 Mixed hyperlipidemia: Secondary | ICD-10-CM

## 2016-07-14 DIAGNOSIS — I10 Essential (primary) hypertension: Secondary | ICD-10-CM

## 2016-07-14 DIAGNOSIS — R000 Tachycardia, unspecified: Secondary | ICD-10-CM

## 2016-07-14 DIAGNOSIS — Z720 Tobacco use: Secondary | ICD-10-CM

## 2016-07-14 MED ORDER — METOPROLOL SUCCINATE ER 50 MG PO TB24: 50.0000 mg | ORAL_TABLET | Freq: Every day | ORAL | 3 refills | Status: DC
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
## 2016-07-14 NOTE — Patient Instructions (Signed)
Medication Instructions:  BEGIN taking 50mg  Toprol XL (1 tablet) every evening.  (This is in addition to the 100mg  of Toprol XL in the AM)  Labwork: NONE  Testing/Procedures: NONE  Follow-Up: Follow up with Ward Givenshris Berge NP in 3 months. Follow up with Dr. Herbie BaltimoreHarding in 6 months.    If you need a refill on your cardiac medications before your next appointment, please call your pharmacy.

## 2016-07-16 ENCOUNTER — Encounter: Payer: Self-pay | Admitting: Cardiology

## 2016-07-16 DIAGNOSIS — R000 Tachycardia, unspecified: Secondary | ICD-10-CM | POA: Insufficient documentation

## 2016-07-16 NOTE — Assessment & Plan Note (Signed)
Followed by PCP

## 2016-07-16 NOTE — Assessment & Plan Note (Signed)
To date, no signs that there is a secondary etiology for hypertension. Normal renal artery Dopplers

## 2016-07-16 NOTE — Assessment & Plan Note (Signed)
He is had resting heart rates in the 90s during his last visits and is at 205 today. Best course of action would be to increase his metoprolol dose. We can target as high as 200 mg a day.

## 2016-07-16 NOTE — Assessment & Plan Note (Signed)
Briefly mentioned need to quit. But did not fully counsel.

## 2016-07-16 NOTE — Assessment & Plan Note (Signed)
Blood pressure looks much better than it was. But otherwise still not quite at goal. With his tachycardia, I think we have room to increase his beta blocker. Plan: Increase metoprolol succinate dose to 150 mg daily. He will split this at 100 mg in the morning and 50 mg in the evening.  No comment on diastolic function on echocardiogram - does not appear to have developed hypertensive heart disease at this point.

## 2016-07-22 ENCOUNTER — Encounter: Payer: Self-pay | Admitting: Family Medicine

## 2016-07-22 ENCOUNTER — Ambulatory Visit (INDEPENDENT_AMBULATORY_CARE_PROVIDER_SITE_OTHER): Payer: 59 | Admitting: Family Medicine

## 2016-07-22 VITALS — BP 130/80 | HR 84 | Temp 98.6°F | Resp 18 | Ht 71.0 in | Wt 229.0 lb

## 2016-07-22 DIAGNOSIS — R200 Anesthesia of skin: Secondary | ICD-10-CM | POA: Diagnosis not present

## 2016-07-22 DIAGNOSIS — Z720 Tobacco use: Secondary | ICD-10-CM | POA: Diagnosis not present

## 2016-07-22 DIAGNOSIS — I10 Essential (primary) hypertension: Secondary | ICD-10-CM | POA: Diagnosis not present

## 2016-07-22 DIAGNOSIS — E782 Mixed hyperlipidemia: Secondary | ICD-10-CM

## 2016-07-22 MED ORDER — LOSARTAN POTASSIUM-HCTZ 50-12.5 MG PO TABS: 1.0000 | ORAL_TABLET | Freq: Two times a day (BID) | ORAL | 3 refills | Status: DC

## 2016-07-22 NOTE — Patient Instructions (Signed)
Blood pressure is great  Orthopedic referral placed  I am glad you are trying to quit smoking  [facility] I will notify you of your test results  See me in six months

## 2016-07-22 NOTE — Progress Notes (Addendum)
Chief Complaint  Patient presents with  . Follow-up    htn   Patient was medically cleared by his cardiologist, and his medicine adjusted. His blood pressures under good control. He is compliant with his medications. He is trying to quit smoking. He states a pack lasts him for days or more. He is making an effort to quit. He has a history of hyperlipidemia. No recent lab work. This is ordered today. He has complaints of pain in his left elbow with numbness to his fourth and fifth fingers. He also is complaining of pain in his right shoulder. He is referred to orthopedics for these.  Patient Active Problem List   Diagnosis Date Noted  . Sinus tachycardia by electrocardiogram 07/16/2016  . Accelerated hypertension 05/08/2016  . Orthopnea 05/08/2016  . Essential hypertension 03/23/2016  . Uncontrolled hypertension 03/23/2016  . Tobacco abuse 03/23/2016  . HLD (hyperlipidemia) 03/23/2016    Outpatient Encounter Prescriptions as of 07/22/2016  Medication Sig  . amLODipine (NORVASC) 10 MG tablet Take 1 tablet (10 mg total) by mouth daily.  . cyclobenzaprine (FLEXERIL) 10 MG tablet Take 1 tablet (10 mg total) by mouth 2 (two) times daily as needed for muscle spasms.  . diclofenac sodium (VOLTAREN) 1 % GEL Apply 2 g topically 4 (four) times daily.  Marland Kitchen. ibuprofen (ADVIL,MOTRIN) 800 MG tablet Take 1 tablet (800 mg total) by mouth 3 (three) times daily.  Marland Kitchen. losartan-hydrochlorothiazide (HYZAAR) 50-12.5 MG tablet Take 1 tablet by mouth 2 (two) times daily.  . metoprolol succinate (TOPROL-XL) 50 MG 24 hr tablet Take 1 tablet (50 mg total) by mouth daily with supper. Take with or immediately following a meal. (Patient taking differently: Take 75 mg by mouth daily with supper. Take with or immediately following a meal.)  . Multiple Vitamin (MULTIVITAMIN WITH MINERALS) TABS tablet Take 1 tablet by mouth daily.   No facility-administered encounter medications on file as of 07/22/2016.     Allergies   Allergen Reactions  . Toradol [Ketorolac Tromethamine] Hives    Review of Systems  Constitutional: Negative.  Negative for fatigue and fever.  HENT: Negative.  Negative for postnasal drip and rhinorrhea.   Eyes: Negative.  Negative for visual disturbance.  Respiratory: Negative.  Negative for cough and stridor.   Cardiovascular: Negative for chest pain, palpitations and leg swelling.  Gastrointestinal: Negative for constipation and diarrhea.  Genitourinary: Negative for difficulty urinating and frequency.  Musculoskeletal: Positive for neck stiffness. Negative for arthralgias and back pain.  Neurological: Positive for numbness. Negative for dizziness and headaches.  Psychiatric/Behavioral: Negative.  Negative for dysphoric mood and self-injury. The patient is not nervous/anxious.     BP 130/80 (BP Location: Right Arm, Patient Position: Sitting, Cuff Size: Large)   Pulse 84   Temp 98.6 F (37 C) (Oral)   Resp 18   Ht 5\' 11"  (1.803 m)   Wt 229 lb 0.6 oz (103.9 kg)   SpO2 98%   BMI 31.94 kg/m   Physical Exam  Constitutional: He is oriented to person, place, and time. He appears well-developed and well-nourished. No distress.  HENT:  Head: Normocephalic and atraumatic.  Mouth/Throat: Oropharynx is clear and moist.  Eyes: Conjunctivae are normal. Pupils are equal, round, and reactive to light.  Neck: Normal range of motion. Neck supple.  Posterior cervical node palpable on the right  Cardiovascular: Normal rate, regular rhythm and normal heart sounds.   Pulmonary/Chest: Effort normal and breath sounds normal. No respiratory distress. He has no wheezes.  Musculoskeletal: Normal range of motion. He exhibits no edema.  Lymphadenopathy:    He has cervical adenopathy.  Neurological: He is alert and oriented to person, place, and time.  Psychiatric: He has a normal mood and affect. His behavior is normal. Thought content normal.    ASSESSMENT/PLAN:  1. Accelerated  hypertension controlled on this visit - BASIC METABOLIC PANEL WITH GFR  2. Mixed hyperlipidemia - Lipid Profile. Discussed impact of low HDL and cigarette smoking on risk of cardiovascular disease.  3. Tobacco abuse Patient is making an effort to quit on his own  4. Numbness of left hand - Ambulatory referral to Orthopedic Surgery   Patient Instructions  Blood pressure is great  Orthopedic referral placed  I am glad you are trying to quit smoking  Blood work is ordered for you to get at [facility] I will notify you of your test results  See me in six months   [name], MD

## 2016-07-30 ENCOUNTER — Other Ambulatory Visit: Payer: Self-pay

## 2016-07-30 ENCOUNTER — Other Ambulatory Visit: Payer: Self-pay | Admitting: Cardiology

## 2016-07-30 MED ORDER — LOSARTAN POTASSIUM-HCTZ 50-12.5 MG PO TABS: 1.0000 | ORAL_TABLET | Freq: Two times a day (BID) | ORAL | 3 refills | Status: DC

## 2016-07-30 MED ORDER — METOPROLOL SUCCINATE ER 50 MG PO TB24: ORAL_TABLET | ORAL | 3 refills | Status: DC

## 2016-07-30 MED ORDER — AMLODIPINE BESYLATE 10 MG PO TABS: 10.0000 mg | ORAL_TABLET | Freq: Every day | ORAL | 3 refills | Status: DC

## 2016-07-30 NOTE — Telephone Encounter (Signed)
New Message   *STAT* If patient is at the pharmacy, call can be transferred to refill team.   1. Which medications need to be refilled? (please list name of each medication and dose if known) Metoprolol 50mg    2. Which pharmacy/location (including street and city if local pharmacy) is medication to be sent to? [facility] [place]  3. Do they need a 30 day or 90 day supply? 90  Per pt insurance company will not [facility]. Pt would [facility] [place]. Please call back to discuss

## 2016-07-30 NOTE — Telephone Encounter (Signed)
Rx(s) sent to pharmacy electronically.  

## 2016-07-31 ENCOUNTER — Other Ambulatory Visit: Payer: Self-pay

## 2016-07-31 MED ORDER — METOPROLOL SUCCINATE ER 50 MG PO TB24: ORAL_TABLET | ORAL | 3 refills | Status: DC

## 2016-08-17 ENCOUNTER — Emergency Department (HOSPITAL_COMMUNITY)
Admission: EM | Admit: 2016-08-17 | Discharge: 2016-08-17 | Disposition: A | Discharge status: Home / Self Care | Payer: 59 | Attending: Emergency Medicine | Admitting: Emergency Medicine

## 2016-08-17 ENCOUNTER — Encounter (HOSPITAL_COMMUNITY): Payer: Self-pay | Admitting: Emergency Medicine

## 2016-08-17 DIAGNOSIS — Z5321 Procedure and treatment not carried out due to patient leaving prior to being seen by health care provider: Secondary | ICD-10-CM | POA: Diagnosis not present

## 2016-08-17 DIAGNOSIS — Y92410 Unspecified street and highway as the place of occurrence of the external cause: Secondary | ICD-10-CM | POA: Diagnosis not present

## 2016-08-17 DIAGNOSIS — S0990XA Unspecified injury of head, initial encounter: Secondary | ICD-10-CM | POA: Insufficient documentation

## 2016-08-17 DIAGNOSIS — F17200 Nicotine dependence, unspecified, uncomplicated: Secondary | ICD-10-CM | POA: Insufficient documentation

## 2016-08-17 DIAGNOSIS — I10 Essential (primary) hypertension: Secondary | ICD-10-CM | POA: Diagnosis not present

## 2016-08-17 DIAGNOSIS — Y939 Activity, unspecified: Secondary | ICD-10-CM | POA: Insufficient documentation

## 2016-08-17 DIAGNOSIS — Y999 Unspecified external cause status: Secondary | ICD-10-CM | POA: Insufficient documentation

## 2016-08-17 NOTE — ED Notes (Signed)
Called for second time 

## 2016-08-17 NOTE — ED Notes (Signed)
No response when called back to room. 
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
## 2016-09-03 ENCOUNTER — Ambulatory Visit (INDEPENDENT_AMBULATORY_CARE_PROVIDER_SITE_OTHER): Payer: Self-pay | Admitting: Orthopedic Surgery

## 2016-09-04 ENCOUNTER — Encounter: Payer: Self-pay | Admitting: Orthopaedic Surgery

## 2016-09-14 ENCOUNTER — Encounter: Payer: Self-pay | Admitting: Family Medicine

## 2016-09-14 ENCOUNTER — Ambulatory Visit (INDEPENDENT_AMBULATORY_CARE_PROVIDER_SITE_OTHER): Payer: 59 | Admitting: Family Medicine

## 2016-09-14 VITALS — BP 136/82 | HR 84 | Temp 97.8°F | Resp 16 | Ht 71.0 in | Wt 233.1 lb

## 2016-09-14 DIAGNOSIS — I10 Essential (primary) hypertension: Secondary | ICD-10-CM

## 2016-09-14 DIAGNOSIS — Z720 Tobacco use: Secondary | ICD-10-CM | POA: Diagnosis not present

## 2016-09-14 NOTE — Patient Instructions (Signed)
Continue current medications See me in July as scheduled You may changer your appointment to accommodate your work schedule -  You do not need to lose work to see me  I will complete forms to continue to allow time off I will allow one day a month

## 2016-09-14 NOTE — Progress Notes (Signed)
    Chief Complaint  Patient presents with  . Follow-up    FMLA   BP doing well No symptoms Wants me to complete FMLA forms for his job I explained that he should NOT need more than a couple of visits a year for his BP and that most people do not need FMLA for controlled blood pressure.  He can make appointments with me during time off.   He feels his company has inordinately strict leave rules and he needs this FMLA for "job protection".  He is using his FMLA leave for time off OTHER than his BP, such as bereavement leave and sickness.  Patient Active Problem List   Diagnosis Date Noted  . Essential hypertension 03/23/2016  . Tobacco abuse 03/23/2016  . HLD (hyperlipidemia) 03/23/2016    Outpatient Encounter Prescriptions as of 09/14/2016  Medication Sig  . amLODipine (NORVASC) 10 MG tablet Take 1 tablet (10 mg total) by mouth daily.  Marland Kitchen. ibuprofen (ADVIL,MOTRIN) 800 MG tablet Take 1 tablet (800 mg total) by mouth 3 (three) times daily.  Marland Kitchen. losartan-hydrochlorothiazide (HYZAAR) 50-12.5 MG tablet Take 1 tablet by mouth 2 (two) times daily.  . metoprolol succinate (TOPROL-XL) 50 MG 24 hr tablet Take 2 tablets in the morning and 1 tablet in the evening  . Multiple Vitamin (MULTIVITAMIN WITH MINERALS) TABS tablet Take 1 tablet by mouth daily.   No facility-administered encounter medications on file as of 09/14/2016.     Allergies  Allergen Reactions  . Toradol [Ketorolac Tromethamine] Hives    Review of Systems  Constitutional: Negative for activity change, appetite change and unexpected weight change.  HENT: Negative for congestion and dental problem.   Eyes: Negative for photophobia.  Respiratory: Negative for cough and shortness of breath.   Cardiovascular: Negative for chest pain, palpitations and leg swelling.  Neurological: Negative for dizziness and headaches.  Psychiatric/Behavioral: Negative for dysphoric mood. The patient is not nervous/anxious.     BP 136/82   Pulse  84   Temp 97.8 F (36.6 C) (Temporal)   Resp 16   Ht 5\' 11"  (1.803 m)   Wt 233 lb 1.3 oz (105.7 kg)   BMI 32.51 kg/m   Physical Exam  Constitutional: He appears well-developed and well-nourished. No distress.  HENT:  Head: Normocephalic and atraumatic.  Mouth/Throat: Oropharynx is clear and moist.  Cardiovascular: Normal rate and regular rhythm.   Pulmonary/Chest: Effort normal and breath sounds normal.  Neurological: He is alert.  Psychiatric: He has a normal mood and affect. His behavior is normal. Thought content normal.    ASSESSMENT/PLAN:  1. Essential hypertension controlled  2. Tobacco abuse Discussed cessation   Patient Instructions  Continue current medications See me in July as scheduled You may changer your appointment to accommodate your work schedule -  You do not need to lose work to see me  I will complete forms to continue to allow time off I will allow one day a month     Eustace MooreYvonne Sue Aritha Huckeba, MD

## 2016-09-16 ENCOUNTER — Telehealth: Payer: Self-pay | Admitting: Cardiology

## 2016-09-16 ENCOUNTER — Ambulatory Visit (INDEPENDENT_AMBULATORY_CARE_PROVIDER_SITE_OTHER): Payer: 59 | Admitting: Cardiology

## 2016-09-16 ENCOUNTER — Encounter: Payer: Self-pay | Admitting: Cardiology

## 2016-09-16 VITALS — BP 140/102 | HR 97 | Ht 71.0 in | Wt 234.0 lb

## 2016-09-16 DIAGNOSIS — N522 Drug-induced erectile dysfunction: Secondary | ICD-10-CM

## 2016-09-16 DIAGNOSIS — Z0289 Encounter for other administrative examinations: Secondary | ICD-10-CM

## 2016-09-16 DIAGNOSIS — R000 Tachycardia, unspecified: Secondary | ICD-10-CM | POA: Diagnosis not present

## 2016-09-16 DIAGNOSIS — I10 Essential (primary) hypertension: Secondary | ICD-10-CM

## 2016-09-16 NOTE — Patient Instructions (Signed)
Medication Instructions: Take Hyzaar (Losartan-HCTZ) in the morning and Amlodipine at night.  Follow-Up: Your physician recommends that you schedule a follow-up appointment in: April with Ward Givenshris Berge, NP and July with Dr. Herbie BaltimoreHarding.  If you need a refill on your cardiac medications before your next appointment, please call your pharmacy.

## 2016-09-16 NOTE — Telephone Encounter (Signed)
Left message on home vm stating paperwork filled out by Dr. Herbie BaltimoreHarding today will be left at the front for pick up when he is ready.  Told to call back with any questions.
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
## 2016-09-17 DIAGNOSIS — Z0289 Encounter for other administrative examinations: Secondary | ICD-10-CM | POA: Insufficient documentation

## 2016-09-17 DIAGNOSIS — N529 Male erectile dysfunction, unspecified: Secondary | ICD-10-CM | POA: Insufficient documentation

## 2016-09-17 DIAGNOSIS — R000 Tachycardia, unspecified: Secondary | ICD-10-CM | POA: Insufficient documentation

## 2016-09-17 NOTE — Assessment & Plan Note (Signed)
His resting heart rate today is 97 on a total of 150 mg Toprol, this indicates that he probably has an appropriate tachycardia. He does feel his heart rate going up with activities and take a long time to come down. This is probably some deconditioning.  We will probably eventually increased a total of 200 mg of Toprol and follow-up visits, but for now the plan would be to continue adequate hydration and adjust his other blood pressure medications to avoid pitfalls.

## 2016-09-17 NOTE — Assessment & Plan Note (Signed)
In addition to the patient some appointment discussing his blood pressure, the major focus for him was the fact that he required paperwork for his employer's absence policy from work for health issues. Apparently he has to have a paper stating that he has symptoms from his hypertension and will have to come in for intermittent appointments for treatment and management. A simple work excuse note from physician would not suffice. Therefore I spent 15 minutes filling out his paperwork after his clinic visit. I did not realize until the end of the visit this was the intention of the visit.

## 2016-09-17 NOTE — Assessment & Plan Note (Addendum)
He clearly has some erectile dysfunction related to his blood pressure medications. Unfortunately I don't know that we have that many other options. But I recommended that he do is not take amlodipine when he stating he may want to have intercourse that evening. If this isn't in a work, then we can consider something like Viagra or Cialis -- this can be discussed in his 1 month follow-up.

## 2016-09-17 NOTE — Assessment & Plan Note (Addendum)
His blood pressure looks relatively well-controlled now. I'm little bit worried about him having some dizzy spells. I want try to simplify issues for him since he is taking the Toprol split between 2 doses I recommend that he takes the amlodipine at nighttime and the entire 100/25 mg of losartan-HCTZ in the morning. Hopefully this avoid the swelling that happens in the day, and some of the positional dizziness.  We can reassess his blood pressures and other symptoms in a month follow up with one of my physician extenders. At that time we can consider the possibility of adding another medication versus going up to 200 mg Toprol.

## 2016-10-08 ENCOUNTER — Ambulatory Visit (INDEPENDENT_AMBULATORY_CARE_PROVIDER_SITE_OTHER): Payer: 59 | Admitting: Nurse Practitioner

## 2016-10-08 ENCOUNTER — Encounter: Payer: Self-pay | Admitting: Nurse Practitioner

## 2016-10-08 VITALS — BP 136/92 | HR 94 | Ht 71.0 in | Wt 234.4 lb

## 2016-10-08 DIAGNOSIS — N529 Male erectile dysfunction, unspecified: Secondary | ICD-10-CM | POA: Diagnosis not present

## 2016-10-08 DIAGNOSIS — R000 Tachycardia, unspecified: Secondary | ICD-10-CM | POA: Diagnosis not present

## 2016-10-08 DIAGNOSIS — I10 Essential (primary) hypertension: Secondary | ICD-10-CM

## 2016-10-08 MED ORDER — METOPROLOL SUCCINATE ER 100 MG PO TB24: 100.0000 mg | ORAL_TABLET | Freq: Every day | ORAL | 3 refills | Status: DC

## 2016-10-08 MED ORDER — TADALAFIL 10 MG PO TABS: 10.0000 mg | ORAL_TABLET | Freq: Every day | ORAL | 6 refills | Status: DC | PRN

## 2016-10-08 MED ORDER — LOSARTAN POTASSIUM-HCTZ 100-25 MG PO TABS: 1.0000 | ORAL_TABLET | Freq: Every day | ORAL | 3 refills | Status: DC

## 2016-10-08 NOTE — Patient Instructions (Signed)
Medication Instructions:  INCREASE Toprol to  (1 tablet) once daily.  START Cialis  (1 tablet) daily AS NEEDED.  Follow-Up: Your physician wants you to follow-up in: 3 MONTHS with Dr. Herbie Baltimore. You will receive a reminder letter in the mail two months in advance. If you don't receive a letter, please call our office to schedule the follow-up appointment.   Any Other Special Instructions Will Be Listed Below (If Applicable).     If you need a refill on your cardiac medications before your next appointment, please call your pharmacy.

## 2016-10-08 NOTE — Progress Notes (Signed)
Office Visit    Patient Name: William Berry Date of Encounter: 10/08/2016  Primary Care Provider:  [name], MD Primary Cardiologist:  Ranae Palms, MD   Chief Complaint    41 year old male with a history of hypertension, erectile dysfunction in the setting of antihypertensive therapy, and sinus tachycardia, who presents for one-month follow-up.  Past Medical History    Past Medical History:  Diagnosis Date  . Chest pain    a. 10/2003 Cath [facility]): nl cors, pLAd spasm, EF 65%.  . Erectile dysfunction    a. In setting of antihypertensive Rx.  Marland Kitchen Heart murmur as a child    a. 05/2016 Echo: EF 55-60%, no rwma, mild LAE, trace MR/TR.  Marland Kitchen Hyperlipidemia   . Hypertension    a. 05/2016 Renal Duplex:  normal renal arteries.  . Tobacco abuse    Past Surgical History:  Procedure Laterality Date  . CARDIAC CATHETERIZATION      Allergies  Allergies  Allergen Reactions  . Toradol [Ketorolac Tromethamine] Hives    History of Present Illness    41 year old male with the above past medical history including chest pain status post normal cardiac catheterization in 2005, hypertension with normal renal artery duplex in December 2017, and erectile dysfunction the setting of antihypertensive medications. He was last seen in clinic a month ago at which time, he was doing reasonably well. Since then, he has not had any chest pain or dyspnea. He has not been checking his blood pressure at home and is not sure how it trends. He does note that heart rates elevate with minimal activity though this doesn't necessarily limit activity. He is not routinely exercising. He has been compliant with medications. He reports that he is a "salt-a-holic" and add salt to pretty much everything. He and his wife also eat out frequently. His biggest concern today is that of erectile dysfunction. He is able to achieve an erection but has trouble maintaining it. He is interested in therapy for  this.  Home Medications    Prior to Admission medications   Medication Sig Start Date End Date Taking? Authorizing Provider  amLODipine (NORVASC) 10 MG tablet Take 1 tablet (10 mg total) by mouth daily. 07/30/16  Yes [name], MD  ibuprofen (ADVIL,MOTRIN) 800 MG tablet Take 1 tablet (800 mg total) by mouth 3 (three) times daily. 05/06/16  Yes Samantha Tripp Dowless, PA-C  Multiple Vitamin (MULTIVITAMIN WITH MINERALS) TABS tablet Take 1 tablet by mouth daily.   Yes Historical Provider, MD  losartan-hydrochlorothiazide (HYZAAR) 100-25 MG tablet Take 1 tablet by mouth daily. 10/08/16   Ok Anis, NP  metoprolol succinate (TOPROL-XL) 100 MG 24 hr tablet Take 1 tablet (100 mg total) by mouth daily. Take with or immediately following a meal. 10/08/16 01/06/17  Ok Anis, NP  tadalafil (CIALIS) 10 MG tablet Take 1 tablet (10 mg total) by mouth daily as needed for erectile dysfunction. 10/08/16   Ok Anis, NP    Review of Systems    As above, occasional elevation in heart rates with activity. Biggest complaint today is erectile dysfunction and difficulty maintaining an erection.  He denies chest pain, dyspnea, pnd, orthopnea, n, v, dizziness, syncope, edema, weight gain, or early satiety.  All other systems reviewed and are otherwise negative except as noted above.  Physical Exam    VS:  BP (!) 136/92   Pulse 94   Ht  (1.803 m)   Wt 234 lb 6.4  oz (106.3 kg)   BMI 32.69 kg/m  , BMI Body mass index is 32.69 kg/m. GEN: Well nourished, well developed, in no acute distress.  HEENT: normal.  Neck: Supple, no JVD, carotid bruits, or masses. Cardiac: RRR, no murmurs, rubs, or gallops. No clubbing, cyanosis, edema.  Radials/DP/PT 2+ and equal bilaterally.  Respiratory:  Respirations regular and unlabored, clear to auscultation bilaterally. GI: Soft, nontender, nondistended, BS + x 4. MS: no deformity or atrophy. Skin: warm and dry, no rash. Neuro:  Strength  and sensation are intact. Psych: Normal affect.  Accessory Clinical Findings    ECG - Regular sinus rhythm, 94, nonspecific T changes.  Assessment & Plan    1.  Essential hypertension: Blood pressure is mildly elevated today at 136/92. Resting heart rate is 94. As previously discussed with Dr. Harding sHerbie Baltimoreim one month ago, I will increase his Toprol-XL to 100 mg twice a day. For ease of use, we will also send in a prescription for Hyzaar 100-25 mg daily. Currently he is doing 50-12.5, 2 tabs daily. He reports that he add salt to pretty much everything and frequently eats out. He is disappointed that he has developed erectile dysfunction in the setting of antihypertensive therapy. We discussed nonpharmacologic ways that he could address his blood pressure, including restriction of dietary sodium intake and increasing activity with a goal for weight loss.  2. Erectile dysfunction: This is been going on for some time and leading to difficulty in his marital relationship. He has trouble maintaining an erection. He is interested in Cialis therapy and this has been prescribed at 10 mg daily when necessary #15 with 6 refills. He is aware that affects may last up to 36 hours and that he has not used nitrates after usage. Fortunately, he does not have a recent history of chest pain and is known to have normal coronary arteries.  3. Sinus tachycardia: Question inappropriate sinus tachycardia. His resting heart rates are in the 90s despite being on Toprol-XL 150 mg daily. He notes that rates elevate with minimal activity. I am increasing his Toprol to 100 mg twice a day today.  4. Disposition: Patient will follow up in clinic in July with Dr. Herbie Baltimore as previously planned.   Nicolasa Ducking, NP 10/08/2016, 4:42 PM

## 2016-10-13 ENCOUNTER — Telehealth: Payer: Self-pay | Admitting: Nurse Practitioner

## 2016-10-13 NOTE — Telephone Encounter (Signed)
°*  STAT* If patient is at the pharmacy, call can be transferred to refill team.   1. Which medications need to be refilled? (please list name of each medication and dose if known) viagra  2. Which pharmacy/location (including street and city if local pharmacy) is medication to be sent to?[address] - [place], [place] - [address]  3. Do they need a 30 day or 90 day supply? 90

## 2016-10-13 NOTE — Telephone Encounter (Signed)
°*  STAT* If patient is at the pharmacy, call can be transferred to refill team.   1. Which medications need to be refilled? (please list name of each medication and dose if known) Viagra  2. Which pharmacy/location (including street and city if local pharmacy) is medication to be sent to?[address] - [place], [place] - [address]  3. Do they need a 30 day or 90 day supply? 90

## 2016-10-14 MED ORDER — SILDENAFIL CITRATE 50 MG PO TABS: 50.0000 mg | ORAL_TABLET | Freq: Every day | ORAL | 3 refills | Status: AC | PRN

## 2016-10-14 NOTE — Telephone Encounter (Signed)
Left msg for patient, advising that an Rx was sent, if questions to call.

## 2016-10-14 NOTE — Telephone Encounter (Signed)
Spoke to patient. Notes the Cialis would be cost prohibitive for him ($300 after insurance) - he would like to know if he can get viagra instead. If so, need OK from prescriber, recommended dosing.  He also states this would need to be for a 90-day supply - his insurance prefers this. I explained that since this is a typically written as a PRN med I'm not sure this will be required. I'll follow up with the pharmacy on how best to set this up. [facility]'ll want [facility] [place]. Mentioned to patient that if insurance doesn't cover, we do have option to send to [facility] in [place] for the generic  Sildenafil tablets (they will take an Rx reading "Take 2-5 tablets as needed for sexual activity"). Explained that this may not be needed since the  strength is now generic.   Will seek advice/OK from [facility] and f/u w patient. Pt voiced understanding and thanks.  Requests prompt review on this as he has upcoming anniversary.

## 2016-10-14 NOTE — Telephone Encounter (Signed)
Viagra 50 mg daily prn, #90 with three refills is fine.  He had indicated at his clinic visit that he would likely require that amount.

## 2016-10-14 NOTE — Telephone Encounter (Signed)
New message    Pt is returning call to rn about medication.

## 2016-10-14 NOTE — Telephone Encounter (Signed)
Left msg to call.

## 2016-10-14 NOTE — Telephone Encounter (Signed)
LM2CB VIAGRA?-JUST REFILLED GEN. CIALIS #15 W/6 [facility]S TO [facility]

## 2016-10-14 NOTE — Telephone Encounter (Signed)
f/u message  Pt calling about medication Cialis. Please call back to discuss

## 2016-10-28 ENCOUNTER — Telehealth: Payer: Self-pay | Admitting: Cardiology

## 2016-10-28 NOTE — Telephone Encounter (Signed)
New message    Pt is calling asking for RN to call. He said it is regarding paper work.

## 2016-10-28 NOTE — Telephone Encounter (Signed)
Spoke with pt, he reports dr harding had filled out some accomendation paperwork in the past. He is needing more paperwork filled out and would like to talk to dr harding's nurse to explain exactly what he needs so it can be relayed to dr harding. Will forward to sharon to follow up with the patient.

## 2016-10-29 NOTE — Telephone Encounter (Signed)
Please call,when you have time,pt is aware you are seeing patients.

## 2016-10-30 NOTE — Telephone Encounter (Signed)
Left message to a call you back

## 2016-10-30 NOTE — Telephone Encounter (Signed)
Spoke to patient. He needs a form filled out for work--will bring form to office on Monday- patient aware to bring as early as possible. Form needs to faxed back by 11/04/16 Verbalized understanding

## 2016-11-12 ENCOUNTER — Encounter (HOSPITAL_COMMUNITY): Payer: Self-pay | Admitting: Emergency Medicine

## 2016-11-12 ENCOUNTER — Telehealth: Payer: Self-pay | Admitting: Family Medicine

## 2016-11-12 ENCOUNTER — Emergency Department (HOSPITAL_COMMUNITY)
Admission: EM | Admit: 2016-11-12 | Discharge: 2016-11-12 | Disposition: A | Discharge status: Home / Self Care | Payer: 59 | Attending: Emergency Medicine | Admitting: Emergency Medicine

## 2016-11-12 ENCOUNTER — Emergency Department (HOSPITAL_COMMUNITY): Payer: 59

## 2016-11-12 DIAGNOSIS — F17210 Nicotine dependence, cigarettes, uncomplicated: Secondary | ICD-10-CM | POA: Diagnosis not present

## 2016-11-12 DIAGNOSIS — Z79899 Other long term (current) drug therapy: Secondary | ICD-10-CM | POA: Insufficient documentation

## 2016-11-12 DIAGNOSIS — K625 Hemorrhage of anus and rectum: Secondary | ICD-10-CM | POA: Diagnosis not present

## 2016-11-12 DIAGNOSIS — I10 Essential (primary) hypertension: Secondary | ICD-10-CM | POA: Insufficient documentation

## 2016-11-12 DIAGNOSIS — R1032 Left lower quadrant pain: Secondary | ICD-10-CM | POA: Diagnosis present

## 2016-11-12 DIAGNOSIS — R1030 Lower abdominal pain, unspecified: Secondary | ICD-10-CM

## 2016-11-12 LAB — COMPREHENSIVE METABOLIC PANEL
ALBUMIN: 4 g/dL (ref 3.5–5.0)
ALT: 26 U/L (ref 17–63)
ANION GAP: 7 (ref 5–15)
AST: 21 U/L (ref 15–41)
Alkaline Phosphatase: 42 U/L (ref 38–126)
BILIRUBIN TOTAL: 0.7 mg/dL (ref 0.3–1.2)
BUN: 11 mg/dL (ref 6–20)
CHLORIDE: 105 mmol/L (ref 101–111)
CO2: 26 mmol/L (ref 22–32)
Calcium: 9.2 mg/dL (ref 8.9–10.3)
Creatinine, Ser: 1.13 mg/dL (ref 0.61–1.24)
GFR calc Af Amer: >60 mL/min (ref >60)
GFR calc non Af Amer: >60 mL/min (ref >60)
GLUCOSE: 129 mg/dL — AB (ref 65–99)
POTASSIUM: 3.5 mmol/L (ref 3.5–5.1)
SODIUM: 138 mmol/L (ref 135–145)
Total Protein: 7.3 g/dL (ref 6.5–8.1)

## 2016-11-12 LAB — URINALYSIS, ROUTINE W REFLEX MICROSCOPIC
BILIRUBIN URINE: NEGATIVE
Glucose, UA: NEGATIVE mg/dL
Hgb urine dipstick: NEGATIVE
Ketones, ur: NEGATIVE mg/dL
LEUKOCYTES UA: NEGATIVE
NITRITE: NEGATIVE
Protein, ur: NEGATIVE mg/dL
SPECIFIC GRAVITY, URINE: 1.029 (ref 1.005–1.030)
pH: 5 (ref 5.0–8.0)

## 2016-11-12 LAB — CBC
HEMATOCRIT: 46.9 % (ref 39.0–52.0)
HEMOGLOBIN: 15.4 g/dL (ref 13.0–17.0)
MCH: 29.7 pg (ref 26.0–34.0)
MCHC: 32.8 g/dL (ref 30.0–36.0)
MCV: 90.4 fL (ref 78.0–100.0)
Platelets: 309 10*3/uL (ref 150–400)
RBC: 5.19 MIL/uL (ref 4.22–5.81)
RDW: 14.7 % (ref 11.5–15.5)
WBC: 7 10*3/uL (ref 4.0–10.5)

## 2016-11-12 LAB — LIPASE, BLOOD: LIPASE: 42 U/L (ref 11–51)

## 2016-11-12 MED ORDER — ONDANSETRON HCL 4 MG/2ML IJ SOLN
4.0000 mg | Freq: Once | INTRAMUSCULAR | Status: AC
  Administered 2016-11-12: 4 mg via INTRAVENOUS
  Filled 2016-11-12: qty 2

## 2016-11-12 MED ORDER — MORPHINE SULFATE (PF) 4 MG/ML IV SOLN
4.0000 mg | Freq: Once | INTRAVENOUS | Status: AC
  Administered 2016-11-12: 4 mg via INTRAVENOUS
  Filled 2016-11-12: qty 1

## 2016-11-12 MED ORDER — SODIUM CHLORIDE 0.9 % IV BOLUS (SEPSIS)
1000.0000 mL | Freq: Once | INTRAVENOUS | Status: AC
  Administered 2016-11-12: 1000 mL via INTRAVENOUS

## 2016-11-12 MED ORDER — ONDANSETRON HCL 4 MG PO TABS: 4.0000 mg | ORAL_TABLET | Freq: Three times a day (TID) | ORAL | 0 refills | Status: DC | PRN

## 2016-11-12 MED ORDER — IOPAMIDOL (ISOVUE-300) INJECTION 61%
100.0000 mL | Freq: Once | INTRAVENOUS | Status: AC | PRN
  Administered 2016-11-12: 100 mL via INTRAVENOUS

## 2016-11-12 NOTE — Telephone Encounter (Signed)
Patient went to ER [facility] in [place] (i already faxed requesting records) because he has blood in his stool. He said his stomach is in a lot of pain, and he needs a referral to a gastrologist.  Cb# [phone]

## 2016-11-12 NOTE — ED Provider Notes (Signed)
[facility]Y DEPT Provider Note   CSN: [id number] Arrival date & time: 11/12/16  1409     History   Chief Complaint Chief Complaint  Patient presents with  . Abdominal Pain    HPI William Berry is a 41 y.o. male.  The history is provided by the patient.  Abdominal Pain   This is a new problem. The current episode started yesterday. The problem occurs constantly. The problem has not changed since onset.The pain is associated with an unknown factor. The pain is located in the LLQ, suprapubic region and RLQ. The quality of the pain is aching. The pain is moderate. Associated symptoms include hematochezia (BRBPR with wiping, and blood around stool) and nausea. Pertinent negatives include fever, diarrhea, melena, vomiting, constipation, dysuria, frequency and hematuria. The symptoms are aggravated by palpation. Nothing relieves the symptoms.   41 year old male who presents with low abdominal pain starting yesterday at rest. Pain has been constant and unremitting. This morning, had bowel movement with blood around it. States that he had blood on the toilet paper when he wiped. No vomiting, diarrhea, fevers or chills, or urinary complaints. Has not had bleeding before in his stool. States that he initially went to [facility], who told him he needed a colonoscopy but did not have a GI doctor to refer him to. He called his PCP who recommended that he come to our ED for evaluation.  Past Medical History:  Diagnosis Date  . Chest pain    a. 10/2003 Cath [facility]): nl cors, pLAd spasm, EF 65%.  . Erectile dysfunction    a. In setting of antihypertensive Rx.  Marland Kitchen Heart murmur as a child    a. 05/2016 Echo: EF 55-60%, no rwma, mild LAE, trace MR/TR.  Marland Kitchen Hyperlipidemia   . Hypertension    a. 05/2016 Renal Duplex:  normal renal arteries.  . Tobacco abuse     Patient Active Problem List   Diagnosis Date Noted  . Sinus tachycardia 09/17/2016  . Erectile dysfunction 09/17/2016  . Encounter to  obtain excuse from work 09/17/2016  . Essential hypertension 03/23/2016  . Tobacco abuse 03/23/2016  . HLD (hyperlipidemia) 03/23/2016    Past Surgical History:  Procedure Laterality Date  . CARDIAC CATHETERIZATION         Home Medications    Prior to Admission medications   Medication Sig Start Date End Date Taking? Authorizing Provider  amLODipine (NORVASC) 10 MG tablet Take 1 tablet (10 mg total) by mouth daily. 07/30/16   [name], MD  ibuprofen (ADVIL,MOTRIN) 800 MG tablet Take 1 tablet (800 mg total) by mouth 3 (three) times daily. 05/06/16   Dowless, Samantha Tripp, PA-C  losartan-hydrochlorothiazide (HYZAAR) 100-25 MG tablet Take 1 tablet by mouth daily. 10/08/16   Ok Anis, NP  metoprolol succinate (TOPROL-XL) 100 MG 24 hr tablet Take 1 tablet (100 mg total) by mouth daily. Take with or immediately following a meal. 10/08/16 01/06/17  Ok Anis, NP  Multiple Vitamin (MULTIVITAMIN WITH MINERALS) TABS tablet Take 1 tablet by mouth daily.    [provider]  sildenafil (VIAGRA) 50 MG tablet Take 1 tablet (50 mg total) by mouth daily as needed for erectile dysfunction. 10/14/16   Ok Anis, NP    Family History Family History  Problem Relation Age of Onset  . Hypertension Mother   . Heart attack Mother 59  . Hypertension Father   . Heart attack Father 42  . Hypertension Sister   . Hypertension Brother   .  Healthy Sister   . Cancer Maternal Uncle        bone  . Heart failure Maternal Grandmother   . COPD Maternal Grandmother   . COPD Maternal Grandfather     Social History Social History  Substance Use Topics  . Smoking status: Current Some Day Smoker    Packs/day: 0.50    Types: Cigarettes  . Smokeless tobacco: Never Used  . Alcohol use Yes     Comment: occasionally     Allergies   Toradol [ketorolac tromethamine]   Review of Systems Review of Systems  Constitutional: Negative for fever.  Respiratory:  Negative for cough and shortness of breath.   Cardiovascular: Negative for chest pain.  Gastrointestinal: Positive for abdominal pain, hematochezia (BRBPR with wiping, and blood around stool) and nausea. Negative for constipation, diarrhea, melena and vomiting.  Genitourinary: Negative for dysuria, frequency and hematuria.  Hematological: Does not bruise/bleed easily.  All other systems reviewed and are negative.    Physical Exam Updated Vital Signs BP (!) 156/84 (BP Location: Left Arm)   Pulse 95   Temp 98.1 F (36.7 C) (Oral)   Resp 18   Ht 5\' 11"  (1.803 m)   Wt 220 lb (99.8 kg)   SpO2 97%   BMI 30.68 kg/m   Physical Exam Physical Exam  Nursing note and vitals reviewed. Constitutional: Well developed, well nourished, non-toxic, and in no acute distress Head: Normocephalic and atraumatic.  Mouth/Throat: Oropharynx is clear and moist.  Neck: Normal range of motion. Neck supple.  Cardiovascular: Normal rate and regular rhythm.   Pulmonary/Chest: Effort normal and breath sounds normal.  Abdominal: Soft. There is tenderness in the low abdomen, LLQ > suprapubic > RLQ. There is no rebound and no guarding.  Musculoskeletal: Normal range of motion.  Neurological: Alert, no facial droop, fluent speech, moves all extremities symmetrically Skin: Skin is warm and dry.  Psychiatric: Cooperative   ED Treatments / Results  Labs (all labs ordered are listed, but only abnormal results are displayed) Labs Reviewed  COMPREHENSIVE METABOLIC PANEL - Abnormal; Notable for the following:       Result Value   Glucose, Bld 129 (*)    All other components within normal limits  LIPASE, BLOOD  CBC  URINALYSIS, ROUTINE W REFLEX MICROSCOPIC    EKG  EKG Interpretation None       Radiology Ct Abdomen Pelvis W Contrast  Result Date: 11/12/2016 CLINICAL DATA:  LLQ, suprapubic region and RLQ. The quality of the pain is aching. The pain is moderate. Associated symptoms include hematochezia  (BRBPR with wiping, and blood around stool) and nausea. No surgical hx EXAM: CT ABDOMEN AND PELVIS WITH CONTRAST TECHNIQUE: Multidetector CT imaging of the abdomen and pelvis was performed using the standard protocol following bolus administration of intravenous contrast. CONTRAST:  ISOVUE-300 IOPAMIDOL (ISOVUE-300) INJECTION 61% COMPARISON:  10/14/2009 FINDINGS: Lower chest: No acute abnormality. Hepatobiliary: No focal liver abnormality is seen. No gallstones, gallbladder wall thickening, or biliary dilatation. Pancreas: Unremarkable. No pancreatic ductal dilatation or surrounding inflammatory changes. Spleen: Normal in size without focal abnormality. Adrenals/Urinary Tract: No adrenal masses. Small nonobstructing stone in the mid to upper pole of the left kidney. No other intrarenal stones, no renal masses. No hydronephrosis. Normal ureters. Bladder is unremarkable. Stomach/Bowel: Stomach is within normal limits. Appendix appears normal. No evidence of bowel wall thickening, distention, or inflammatory changes. Vascular/Lymphatic: No significant vascular findings are present. No enlarged abdominal or pelvic lymph nodes. Reproductive: Unremarkable. Other: No abdominal wall  hernia or abnormality. No abdominopelvic ascites. Musculoskeletal: No fracture or acute finding. No osteoblastic or osteolytic lesions. Mild disc degenerative changes at L5-S1. IMPRESSION: 1. No acute findings. No findings to account for the patient's symptoms. Normal appendix is visualized. No evidence of diverticulitis. 2. Small nonobstructing stone in the left kidney. Electronically Signed   By: Amie Portlandavid  Ormond M.D.   On: 11/12/2016 17:46    Procedures Procedures (including critical care time)  Medications Ordered in ED Medications  ondansetron (ZOFRAN) injection 4 mg (4 mg Intravenous Given 11/12/16 1629)  morphine 4 MG/ML injection 4 mg (4 mg Intravenous Given 11/12/16 1628)  sodium chloride 0.9 % bolus 1,000 mL (1,000 mLs  Intravenous New Bag/Given 11/12/16 1629)  iopamidol (ISOVUE-300) 61 % injection 100 mL (100 mLs Intravenous Contrast Given 11/12/16 1732)     Initial Impression / Assessment and Plan / ED Course  I have reviewed the triage vital signs and the nursing notes.  Pertinent labs & imaging results that were available during my care of the patient were reviewed by me and considered in my medical decision making (see chart for details).     Presenting with low abdominal pain x 1 day and bright red blood per rectum. He refuses rectal exam here, but his description of blood on toilet paper and blood around stool does not seem severe bleeding. He is not anemic. He is well appearing with stable vitals. Abdomen soft and nonsurgical but bilateral low tenderness. Differential includes potential diverticulitis versus cystitis versus atypical appendicitis. CT visualized and shows no acute intraabdominal processes. Felt stable for discharge home. Referral to GI given for follow-up as needed. Strict return and follow-up instructions reviewed.  He expressed understanding of all discharge instructions and felt comfortable with the plan of care.    Final Clinical Impressions(s) / ED Diagnoses   Final diagnoses:  Lower abdominal pain  BRBPR (bright red blood per rectum)    New Prescriptions New Prescriptions   No medications on file     Lavera GuiseLiu, Segundo Makela Duo, MD 11/12/16 1815

## 2016-11-12 NOTE — ED Triage Notes (Addendum)
Patient c/o lower abd pain that started yesterday. Per patient [facility] after noting blood in stool. Patient reports blood maroon in color. Patient told that he needed a colonoscopy but [facility]. Patient reports nausea but denies vomiting or diarrhea. [facility].

## 2016-11-12 NOTE — Discharge Instructions (Signed)
Please call gastroenterology to set up follow-up appointment. Your work-up today is reassuring. You have a normal CT scan of the abdomen and pelvis. You are not anemic.   Please return without fail for worsening symptoms, including worsening pain, intractable vomiting, worsening bleeding, or any other symptoms concerning to you.

## 2016-11-12 NOTE — Telephone Encounter (Signed)
Went to ER

## 2016-11-12 NOTE — Telephone Encounter (Signed)
Patient called 11:00 seeking appt today  for abdominal pain and blood in stool after going to the bathroom (this happened ago).  Spoke with Selena regarding a work in appt today or tomorrow morning. We are unable to accommodate. Patient feels it is necessary to be seen today.  Advised pt to go to ER. He stated he is in route now.

## 2016-11-26 ENCOUNTER — Encounter: Payer: Self-pay | Admitting: Family Medicine

## 2016-11-26 ENCOUNTER — Ambulatory Visit (INDEPENDENT_AMBULATORY_CARE_PROVIDER_SITE_OTHER): Payer: 59 | Admitting: Family Medicine

## 2016-11-26 VITALS — BP 148/100 | HR 88 | Temp 98.2°F | Resp 16 | Ht 71.0 in | Wt 235.0 lb

## 2016-11-26 DIAGNOSIS — K589 Irritable bowel syndrome without diarrhea: Secondary | ICD-10-CM

## 2016-11-26 DIAGNOSIS — K625 Hemorrhage of anus and rectum: Secondary | ICD-10-CM | POA: Diagnosis not present

## 2016-11-26 MED ORDER — DICYCLOMINE HCL 20 MG PO TABS: 20.0000 mg | ORAL_TABLET | Freq: Four times a day (QID) | ORAL | 0 refills | Status: DC

## 2016-11-26 NOTE — Progress Notes (Signed)
Chief Complaint  Patient presents with  . Rectal Bleeding   Has been to the ER twice.  Notes and results reviewed. Has seen his surgeon and had a normal colonoscopy Is unhappy that a source of the bleed was not found Still with blood on toilet tissue Had abdominal pain and bloating post procedure CT scan showed no perforation or abnormality Somehow he missed a week of work  Is on bland diet Still with vague complaint lower ab pain L>R No OTC meds  I recommend he see a gastroenterologist I am giving him bentyl to see if he is having IBS Sx.  He did not request or receive any work excuse  Patient Active Problem List   Diagnosis Date Noted  . Sinus tachycardia 09/17/2016  . Erectile dysfunction 09/17/2016  . Encounter to obtain excuse from work 09/17/2016  . Essential hypertension 03/23/2016  . Tobacco abuse 03/23/2016  . HLD (hyperlipidemia) 03/23/2016    Outpatient Encounter Prescriptions as of 11/26/2016  Medication Sig  . amLODipine (NORVASC) 10 MG tablet Take 1 tablet (10 mg total) by mouth daily.  Marland Kitchen ibuprofen (ADVIL,MOTRIN) 800 MG tablet Take 1 tablet (800 mg total) by mouth 3 (three) times daily.  Marland Kitchen losartan-hydrochlorothiazide (HYZAAR) 100-25 MG tablet Take 1 tablet by mouth daily.  . metoprolol succinate (TOPROL-XL) 100 MG 24 hr tablet Take 1 tablet (100 mg total) by mouth daily. Take with or immediately following a meal. (Patient taking differently: Take 150 mg by mouth daily. Take with or immediately following a meal.)  . Multiple Vitamin (MULTIVITAMIN WITH MINERALS) TABS tablet Take 1 tablet by mouth daily.  . ondansetron (ZOFRAN) 4 MG tablet Take 1 tablet (4 mg total) by mouth every 8 (eight) hours as needed for nausea or vomiting.  . sildenafil (VIAGRA) 50 MG tablet Take 1 tablet (50 mg total) by mouth daily as needed for erectile dysfunction.  . dicyclomine (BENTYL) 20 MG tablet Take 1 tablet (20 mg total) by mouth every 6 (six) hours. As needed abdominal  cramps   No facility-administered encounter medications on file as of 11/26/2016.     Allergies  Allergen Reactions  . Toradol [Ketorolac Tromethamine] Hives    Review of Systems  Constitutional: Negative for chills and fever.  HENT: Negative for congestion and dental problem.   Eyes: Negative for redness and visual disturbance.  Respiratory: Negative for cough and shortness of breath.   Cardiovascular: Negative for chest pain, palpitations and leg swelling.  Gastrointestinal: Positive for abdominal pain, anal bleeding and nausea. Negative for abdominal distention, constipation, diarrhea and rectal pain.  Genitourinary: Negative for difficulty urinating and flank pain.  Musculoskeletal: Negative for back pain and neck pain.  Neurological: Negative for dizziness and headaches.    BP (!) 148/100 (BP Location: Right Arm, Patient Position: Sitting, Cuff Size: Large)   Pulse 88   Temp 98.2 F (36.8 C) (Temporal)   Resp 16   Ht 5\' 11"  (1.803 m)   Wt 235 lb (106.6 kg)   SpO2 100%   BMI 32.78 kg/m   Physical Exam  Constitutional: He appears well-developed and well-nourished.  HENT:  Head: Normocephalic and atraumatic.  Mouth/Throat: Oropharynx is clear and moist.  Eyes: Conjunctivae are normal. Pupils are equal, round, and reactive to light.  Neck: Normal range of motion.  Cardiovascular: Normal rate, regular rhythm and normal heart sounds.   Pulmonary/Chest: Effort normal and breath sounds normal. No respiratory distress.  Abdominal: Soft. Bowel sounds are normal. He exhibits no distension.  There is no hepatosplenomegaly. There is tenderness in the left lower quadrant. There is no rigidity, no rebound and no guarding.  Lymphadenopathy:    He has no cervical adenopathy.    ASSESSMENT/PLAN:  1. Irritable bowel syndrome without diarrhea  - Ambulatory referral to Gastroenterology  2. Bright red rectal bleeding  - Ambulatory referral to Gastroenterology   Patient  Instructions  Continue limited diet Drink plenty of water Avoid constipation I am referring to gastroenterologist Take bentyl as needed cramps   Eustace MooreYvonne Sue Klarissa Mcilvain, MD

## 2016-11-26 NOTE — Patient Instructions (Signed)
Continue limited diet Drink plenty of water Avoid constipation I am referring to gastroenterologist Take bentyl as needed cramps

## 2016-12-01 ENCOUNTER — Encounter: Payer: Self-pay | Admitting: Internal Medicine

## 2016-12-02 ENCOUNTER — Encounter: Payer: Self-pay | Admitting: Nurse Practitioner

## 2016-12-02 ENCOUNTER — Ambulatory Visit (INDEPENDENT_AMBULATORY_CARE_PROVIDER_SITE_OTHER): Payer: 59 | Admitting: Nurse Practitioner

## 2016-12-02 DIAGNOSIS — R1030 Lower abdominal pain, unspecified: Secondary | ICD-10-CM

## 2016-12-02 DIAGNOSIS — K625 Hemorrhage of anus and rectum: Secondary | ICD-10-CM | POA: Diagnosis not present

## 2016-12-02 DIAGNOSIS — R109 Unspecified abdominal pain: Secondary | ICD-10-CM | POA: Insufficient documentation

## 2016-12-02 NOTE — Progress Notes (Signed)
Primary Care Physician:  Eustace MooreNelson, Yvonne Sue, MD Primary Gastroenterologist:  Dr. Jena Gaussourk  Chief Complaint  Patient presents with  . Rectal Bleeding    started 11/13/16, bright red/maroon; had tcs 11/16/16 by Dr. Gabriel Cirriemason, bleeding x 2 since tcs  . Abdominal Pain    comes and goes; lower mid abd  . Nausea    vomited x 1    HPI:   William Berry is a 41 y.o. male who presents For follow-up on rectal bleeding, abdominal pain, nausea. The patient was seen in the emergency department on 11/12/2016 for heme positive stool/rectal bleeding. Hemoccult card was positive "under only 2 small spots on both sides." Recommend follow-up with Dr. Marlene BastMason for colonoscopy. Colonoscopy was performed and no report is available to me today. The patient went back to the emergency room later that day, on 11/16/2016, with lower abdominal pain 5 hours prior and vomiting for the previous 5 hours. CT of the abdomen found no acute process. Chest x-ray found no acute process. Deemed likely abdominal distention due to colonoscopy, no perforation. Recommend follow-up with Dr. Marlene BastMason.  Today he states he's frustrated. He saw Dr. Gabriel CirrieMason yesterday which said everything looked normal on his colonoscopy. The patient said he's "not happy, wants a second opinion." And "I know it's not normal to be bleeding into your stool and what raised a red flag was that he said I could have some hemorrhoids that he couldn't see and I feel if you have a camera you should be able to see what you need to." Has had hematochezia on the paper and stool x 2 since colonoscopy (about 2 weeks ago). Has abdominal pain mid to lower pain, described as twisting pain. Occurs intermittently about 1-2 times a week; lasts as long as hours. Also with on/off nausea and vomiting x 1. Has a bowel movement daily, consistent with [place] 4, complete emptying. Denies any changes in bowel habits/consistency/character, fever, chills, unintentional weight loss. Denies chest  pain, dyspnea, dizziness, lightheadedness, syncope, near syncope. Denies any other upper or lower GI symptoms.  Has not taken Bentyl (Rx by Dr. Delton SeeNelson - PCP) due to no spasming pains yet.  Past Medical History:  Diagnosis Date  . Chest pain    a. 10/2003 Cath [facility] nl cors, pLAd spasm, EF 65%.  . Erectile dysfunction    a. In setting of antihypertensive Rx.  Marland Kitchen. Heart murmur as a child    a. 05/2016 Echo: EF 55-60%, no rwma, mild LAE, trace MR/TR.  Marland Kitchen. Hyperlipidemia   . Hypertension    a. 05/2016 Renal Duplex:  normal renal arteries.  . Tobacco abuse     Past Surgical History:  Procedure Laterality Date  . CARDIAC CATHETERIZATION      Current Outpatient Prescriptions  Medication Sig Dispense Refill  . amLODipine (NORVASC) 10 MG tablet Take 1 tablet (10 mg total) by mouth daily. 90 tablet 3  . losartan-hydrochlorothiazide (HYZAAR) 100-25 MG tablet Take 1 tablet by mouth daily. 90 tablet 3  . metoprolol succinate (TOPROL-XL) 100 MG 24 hr tablet Take 1 tablet (100 mg total) by mouth daily. Take with or immediately following a meal. (Patient taking differently: Take 150 mg by mouth daily. Take with or immediately following a meal.) 90 tablet 3  . Multiple Vitamin (MULTIVITAMIN WITH MINERALS) TABS tablet Take 1 tablet by mouth daily.    . sildenafil (VIAGRA) 50 MG tablet Take 1 tablet (50 mg total) by mouth daily as needed for erectile dysfunction. 90 tablet  3  . dicyclomine (BENTYL) 20 MG tablet Take 1 tablet (20 mg total) by mouth every 6 (six) hours. As needed abdominal cramps (Patient not taking: Reported on 12/02/2016) 30 tablet 0  . ibuprofen (ADVIL,MOTRIN) 800 MG tablet Take 1 tablet (800 mg total) by mouth 3 (three) times daily. (Patient not taking: Reported on 12/02/2016) 21 tablet 0   No current facility-administered medications for this visit.     Allergies as of 12/02/2016 - Review Complete 12/02/2016  Allergen Reaction Noted  . Toradol [ketorolac tromethamine] Hives  10/23/2014    Family History  Problem Relation Age of Onset  . Hypertension Mother   . Heart attack Mother 73  . Hypertension Father   . Heart attack Father 19  . Hypertension Sister   . Hypertension Brother   . Healthy Sister   . Cancer Maternal Uncle        bone  . Heart failure Maternal Grandmother   . COPD Maternal Grandmother   . COPD Maternal Grandfather   . Colon cancer Neg Hx   . Colon polyps Neg Hx     Social History   Social History  . Marital status: Married    Spouse name: N/A  . Number of children: 0  . 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 Smoking status: Current Some Day Smoker    Packs/day: 0.50    Types: Cigarettes  . Smokeless tobacco: Never Used  . Alcohol use Yes     Comment: occasionally  . Drug use: No  . Sexual activity: Yes   Other Topics Concern  . Not on file   Social History Narrative   Lives with wife. Married 14 years. No children    Review of Systems: General: Negative for anorexia, weight loss, fever, chills, fatigue, weakness. ENT: Negative for hoarseness, difficulty swallowing. CV: Negative for chest pain, angina, palpitations, peripheral edema.  Respiratory: Negative for dyspnea at rest, cough, sputum, wheezing.  GI: See history of present illness. MS: Negative for joint pain, low back pain.  Derm: Negative for rash or itching.  Endo: Negative for unusual weight change.  Heme: Negative for bruising or bleeding. Allergy: Negative for rash or hives.    Physical Exam: BP 139/84   Pulse 83   Temp 98.4 F (36.9 C) (Oral)   Ht 5\' 11"  (1.803 m)   Wt 231 lb (104.8 kg)   BMI 32.22 kg/m  General:   Obese male. Alert and oriented. Pleasant and cooperative. Well-nourished and well-developed.  Head:  Normocephalic and atraumatic. Eyes:  Without icterus, sclera clear and conjunctiva pink.  Ears:  Normal auditory acuity. Cardiovascular:  S1, S2  present without murmurs appreciated.Extremities without clubbing or edema. Respiratory:  Clear to auscultation bilaterally. No wheezes, rales, or rhonchi. No distress.  Gastrointestinal:  +BS, rounded but soft, and non-distended. Mild periumbilical and lower abdominal TTP worst in the LLQ. No HSM noted. No guarding or rebound. No masses appreciated.  Rectal:  Deferred  Musculoskalatal:  Symmetrical without gross deformities. Neurologic:  Alert and oriented x4;  grossly normal neurologically. Psych:  Alert and cooperative. Normal mood and affect. Heme/Lymph/Immune: No excessive bruising noted.    12/02/2016 4:45 PM   Disclaimer: This note was dictated with voice recognition software. Similar sounding words can inadvertently be transcribed and may not be corrected upon review.

## 2016-12-02 NOTE — Patient Instructions (Signed)
1. Use Bentyl that Dr. Delton SeeNelson gave you when you have abdominal pain to give it a try. 2. Have your x-ray done when you're able to. 3. Have your labs done when you're able to. 4. When you complete the stool test, bring it back to our office. 5. Come back in 4-6 weeks for further evaluation and next steps. 6. Call us if you have any severe or worsening symptoms.

## 2016-12-02 NOTE — Assessment & Plan Note (Signed)
The patient has noted rectal bleeding twice since his colonoscopy about 2 weeks ago. At this point I'll check a CBC, CMP, lipase. We will request colonoscopy report from Dr. Barkley Boardsalacin which was completed about 2 weeks ago. I will have him do an iFobT test. Return for follow-up in 4-6 weeks

## 2016-12-02 NOTE — Assessment & Plan Note (Signed)
Noted abdominal pain. He has been given Bentyl by his primary care. At this point I recommend she try the Bentyl for any abdominal pain. I will order an abdominal x-ray 2 view to assess for stool burden to make sure there is not underlying constipation that he is not aware of. I'll also check CBC, CMP, lipase. We will request his previous colonoscopy report from Dr. Marlene BastMason which was completed about 2 weeks ago. Return for follow-up in 4-6 weeks. Call for any severe worsening symptoms.

## 2016-12-03 ENCOUNTER — Encounter: Payer: Self-pay | Admitting: Internal Medicine

## 2016-12-03 NOTE — Progress Notes (Signed)
cc'ed to pcp °

## 2016-12-07 ENCOUNTER — Other Ambulatory Visit (HOSPITAL_COMMUNITY)
Admission: RE | Admit: 2016-12-07 | Discharge: 2016-12-07 | Disposition: A | Discharge status: Home / Self Care | Payer: 59 | Source: Ambulatory Visit | Attending: Nurse Practitioner | Admitting: Nurse Practitioner

## 2016-12-07 ENCOUNTER — Ambulatory Visit (HOSPITAL_COMMUNITY)
Admission: RE | Admit: 2016-12-07 | Discharge: 2016-12-07 | Disposition: A | Discharge status: Home / Self Care | Payer: 59 | Source: Ambulatory Visit | Attending: Nurse Practitioner | Admitting: Nurse Practitioner

## 2016-12-07 DIAGNOSIS — R1030 Lower abdominal pain, unspecified: Secondary | ICD-10-CM | POA: Insufficient documentation

## 2016-12-07 DIAGNOSIS — K625 Hemorrhage of anus and rectum: Secondary | ICD-10-CM | POA: Insufficient documentation

## 2016-12-07 LAB — COMPREHENSIVE METABOLIC PANEL
ALBUMIN: 4 g/dL (ref 3.5–5.0)
ALT: 34 U/L (ref 17–63)
AST: 19 U/L (ref 15–41)
Alkaline Phosphatase: 43 U/L (ref 38–126)
Anion gap: 8 (ref 5–15)
BILIRUBIN TOTAL: 0.7 mg/dL (ref 0.3–1.2)
BUN: 12 mg/dL (ref 6–20)
CO2: 27 mmol/L (ref 22–32)
CREATININE: 1.04 mg/dL (ref 0.61–1.24)
Calcium: 9.1 mg/dL (ref 8.9–10.3)
Chloride: 103 mmol/L (ref 101–111)
GFR calc Af Amer: >60 mL/min (ref >60)
GLUCOSE: 92 mg/dL (ref 65–99)
Potassium: 3.3 mmol/L — ABNORMAL LOW (ref 3.5–5.1)
Sodium: 138 mmol/L (ref 135–145)
TOTAL PROTEIN: 7.5 g/dL (ref 6.5–8.1)

## 2016-12-07 LAB — CBC WITH DIFFERENTIAL/PLATELET
BASOS ABS: 0.1 10*3/uL (ref 0.0–0.1)
Basophils Relative: 0 %
Eosinophils Absolute: 0.1 10*3/uL (ref 0.0–0.7)
Eosinophils Relative: 1 %
HCT: 48.1 % (ref 39.0–52.0)
Hemoglobin: 15.4 g/dL (ref 13.0–17.0)
Lymphocytes Relative: 42 %
Lymphs Abs: 5.8 10*3/uL — ABNORMAL HIGH (ref 0.7–4.0)
MCH: 29.1 pg (ref 26.0–34.0)
MCHC: 32 g/dL (ref 30.0–36.0)
MCV: 90.9 fL (ref 78.0–100.0)
Monocytes Absolute: 1.2 10*3/uL — ABNORMAL HIGH (ref 0.1–1.0)
Monocytes Relative: 8 %
NEUTROS ABS: 6.6 10*3/uL (ref 1.7–7.7)
NEUTROS PCT: 49 %
Platelets: 357 10*3/uL (ref 150–400)
RBC: 5.29 MIL/uL (ref 4.22–5.81)
RDW: 15.3 % (ref 11.5–15.5)
WBC: 13.8 10*3/uL — AB (ref 4.0–10.5)

## 2016-12-07 LAB — LIPASE, BLOOD: LIPASE: 37 U/L (ref 11–51)

## 2016-12-24 ENCOUNTER — Telehealth: Payer: Self-pay | Admitting: Cardiology

## 2016-12-24 NOTE — Telephone Encounter (Signed)
New message   Pt verbalized that he is calling to get his fmla papers   Please call pt

## 2016-12-24 NOTE — Telephone Encounter (Signed)
Spoke with patient and he stated papers were faxed and ok for Jasmine DecemberSharon to just call him back tomorrow  Will forward to [employer]

## 2016-12-24 NOTE — Telephone Encounter (Signed)
Pt says he wants to talk directly to the nurse,he wants to make sure forms are filled out correctly.

## 2016-12-25 NOTE — Telephone Encounter (Signed)
Left message to call back in regards to fmla. Call back on Monday 12/2716

## 2016-12-28 ENCOUNTER — Telehealth: Payer: Self-pay | Admitting: *Deleted

## 2016-12-28 ENCOUNTER — Ambulatory Visit: Payer: 59 | Admitting: Cardiology

## 2016-12-28 NOTE — Telephone Encounter (Signed)
Patient called checking on his FMLA forms he brought in last Thursday, I made him aware that Dr Delton SeeNelson is out of the office until Thursday and I made him aware of what Dr Delton SeeNelson stated about his forms she may not can do it because fmla is for one problem and he has multiple problems. Patient stated he wants the nurse to call him back because it is not across the board and he can lose his job if he does not have that form in Friday. Please advise

## 2016-12-31 ENCOUNTER — Telehealth: Payer: Self-pay | Admitting: *Deleted

## 2016-12-31 NOTE — Telephone Encounter (Signed)
Patient called back wanting to speak with Kaleen OdeaBreanna regarding his forms for work. Please call patient back

## 2016-12-31 NOTE — Telephone Encounter (Signed)
I completed his forms.  We will fax today.  I authorized days off for all of his ER visits, Colonoscopy visit, and medical visits.  I cannot retroactively authorize for days he took off without my permission - this simply is not done.

## 2016-12-31 NOTE — Telephone Encounter (Signed)
Called patient regarding message below. No answer, left generic message for patient to return call.   

## 2016-12-31 NOTE — Telephone Encounter (Signed)
Patient is asking for the exact dates he is getting coverage for by Dr Delton SeeNelson.

## 2016-12-31 NOTE — Telephone Encounter (Signed)
Called patient again, informed of note below, he verbalized understanding.

## 2016-12-31 NOTE — Telephone Encounter (Signed)
Form already submitted.  Do we still have?

## 2016-12-31 NOTE — Telephone Encounter (Signed)
Dates read off form to patient.

## 2017-01-06 ENCOUNTER — Ambulatory Visit: Payer: 59 | Admitting: Gastroenterology

## 2017-01-12 ENCOUNTER — Encounter: Payer: Self-pay | Admitting: *Deleted

## 2017-01-12 ENCOUNTER — Ambulatory Visit: Payer: 59 | Admitting: Cardiology

## 2017-01-13 ENCOUNTER — Telehealth: Payer: Self-pay | Admitting: Cardiology

## 2017-01-13 NOTE — Telephone Encounter (Signed)
Received [employer] paperwork -Accomodation Forms back  from Sharon Martin, RN that Dr HerbieBurt Knack BaltimoreHarding has signed.  Ermalene SearingS. Martin advised patient to pick up Paperwork. lp

## 2017-01-14 ENCOUNTER — Telehealth: Payer: Self-pay | Admitting: Family Medicine

## 2017-01-14 NOTE — Telephone Encounter (Signed)
Please call patient today re out of work dates. Not having info correct can lead to termination.   cb  [phone]

## 2017-01-14 NOTE — Telephone Encounter (Signed)
Called patient regarding message below. No answer, left generic message for patient to return call.   

## 2017-01-15 ENCOUNTER — Telehealth: Payer: Self-pay | Admitting: Cardiology

## 2017-01-15 NOTE — Telephone Encounter (Signed)
Called patient regarding message below. No answer, left generic message for patient to return call.   

## 2017-01-15 NOTE — Telephone Encounter (Signed)
Pt c/o of Chest Pain: STAT if CP now or developed within 24 hours  1. Are you having CP right now? no  2. Are you experiencing any other symptoms (ex. SOB, nausea, vomiting, sweating)? Tingles in left arm and hand  3. How long have you been experiencing CP? Just started wed  4. Is your CP continuous or coming and going? Coming and going   5. Have you taken Nitroglycerin? no ?

## 2017-01-15 NOTE — Telephone Encounter (Signed)
Called pt and left detailed message to go to ER.

## 2017-01-15 NOTE — Telephone Encounter (Signed)
Patient is calling regarding FMLA again. He states that all of his problems started on the 17th and he left work to go to ED. His stomach was hurting for a whole week before he had the colonoscopy, had the colonoscopy and was kept out of work the next day. He went back to work and left early to go to ED. He states that he needs the dates put on the form, even for the days he left early because even leaving early counts against him- he is on a points system. He is getting close to termination. He states that he needs more than 8 hours off per month for this. He is asking if the dates on his form include the trip(s) to [place]. He states [place] lives right [place] from [place], and it would not make sense for him to always come to [facility].  He states he has an appt Tuesday.  Ok to leave a detailed message on voicemail

## 2017-01-15 NOTE — Telephone Encounter (Signed)
I excused him for every day documented that he went to a doctor, [place], and coloscopy.  I cannot excuse him for other days.  Do not schedule him a visit to talk about this, he only needs to come for regular medical visits.

## 2017-01-18 ENCOUNTER — Encounter: Payer: Self-pay | Admitting: Physician Assistant

## 2017-01-18 ENCOUNTER — Ambulatory Visit (INDEPENDENT_AMBULATORY_CARE_PROVIDER_SITE_OTHER): Payer: 59 | Admitting: Physician Assistant

## 2017-01-18 VITALS — BP 136/82 | HR 85 | Ht 71.0 in | Wt 233.0 lb

## 2017-01-18 DIAGNOSIS — Z79899 Other long term (current) drug therapy: Secondary | ICD-10-CM | POA: Diagnosis not present

## 2017-01-18 DIAGNOSIS — R1030 Lower abdominal pain, unspecified: Secondary | ICD-10-CM

## 2017-01-18 DIAGNOSIS — E782 Mixed hyperlipidemia: Secondary | ICD-10-CM

## 2017-01-18 DIAGNOSIS — I10 Essential (primary) hypertension: Secondary | ICD-10-CM | POA: Diagnosis not present

## 2017-01-18 DIAGNOSIS — R079 Chest pain, unspecified: Secondary | ICD-10-CM | POA: Diagnosis not present

## 2017-01-18 NOTE — Patient Instructions (Addendum)
Medication Instructions:   No changes.   Labwork:   BMET within 1 week of the coronary CT  Testing/Procedures:  Coronary CT  Your physician has requested that you have cardiac CT. Cardiac computed tomography (CT) is a painless test that uses an x-ray machine to take clear, detailed pictures of your heart. For further information please visit [email or web address] Please follow instruction sheet as given.  You will need to get the BMET (electrolyte & kidney function lab test) done within 1 week of the exam. You may get this drawn at our office. You do not need to fast for this test.  On the day of this test, you will need to take your amlodipine, and metoprolol, but do not take your other blood pressure medications (losartan-HCTZ or Bentyl). You may need to get a dose of metoprolol while undergoing the procedure in order to slow your heart rate - we do not want your blood pressure to drop too low.    Follow-Up:  Follow up as scheduled with Dr. Herbie BaltimoreHarding after test. We will contact you when FMLA paperwork is ready.  If you need a refill on your cardiac medications before your next appointment, please call your pharmacy.

## 2017-01-18 NOTE — Telephone Encounter (Signed)
Patient seen for OV today.

## 2017-01-18 NOTE — Progress Notes (Signed)
Cardiology Office Note    Date:  01/19/2017   ID:  ASCENCION COYE, DOB Dec 20, 1975, MRN [id number]  PCP:  [name], MD  Cardiologist:  Dr. Herbie Baltimore  Chief Complaint  Patient presents with  . Follow-up    seen for Dr. Herbie Baltimore.   . Chest Pain    History of Present Illness:  NED KAKAR is a 41 y.o. male with PMH of HTN with normal renal artery duplex in 05/2016, Erectile dysfunction in the setting of antihypertensive medication and a history of chest pain s/p normal cardiac catheterization in 05/2007. He was last seen on 10/08/2016 with Ward Givens NP, his Toprol-XL was increased 200 mg twice a day. He was also on Hyzaar as well. However during the conversation, it appears he was eating too much salt. He was also complaining of erectile dysfunction was problem maintaining erection, he was given a prescription for Cialis. He presents today for evaluation chest pain.  His service in the recent weeks, he has been having increasing amount of exertional chest discomfort and shortness of breath. He denies any obvious lower extremity edema, orthopnea or paroxysmal nocturnal dyspnea. Given his negative cardiac catheterization in December 2008, my suspicion is this chest pain is likely noncardiac in nature. We discussed various options, eventually he agreed to proceed with coronary CT with possible FFR to further evaluate. If this is negative, I would not recommend any additional workup. He also mentions, he had FLMA forms that were previously signed by Dr. Herbie Baltimore. He says he required frequent office visit beyond the limit that his work typically tolerate. He works at [employer].   Past Medical History:  Diagnosis Date  . Chest pain    a. 10/2003 Cath [facility]): nl cors, pLAd spasm, EF 65%.  . Erectile dysfunction    a. In setting of antihypertensive Rx.  Marland Kitchen Heart murmur as a child    a. 05/2016 Echo: EF 55-60%, no rwma, mild LAE, trace MR/TR.  Marland Kitchen Hyperlipidemia   . Hypertension     a. 05/2016 Renal Duplex:  normal renal arteries.  . Tobacco abuse     Past Surgical History:  Procedure Laterality Date  . CARDIAC CATHETERIZATION      Current Medications: Outpatient Medications Prior to Visit  Medication Sig Dispense Refill  . amLODipine (NORVASC) 10 MG tablet Take 1 tablet (10 mg total) by mouth daily. 90 tablet 3  . dicyclomine (BENTYL) 20 MG tablet Take 1 tablet (20 mg total) by mouth every 6 (six) hours. As needed abdominal cramps 30 tablet 0  . ibuprofen (ADVIL,MOTRIN) 800 MG tablet Take 1 tablet (800 mg total) by mouth 3 (three) times daily. 21 tablet 0  . losartan-hydrochlorothiazide (HYZAAR) 100-25 MG tablet Take 1 tablet by mouth daily. 90 tablet 3  . Multiple Vitamin (MULTIVITAMIN WITH MINERALS) TABS tablet Take 1 tablet by mouth daily.    . sildenafil (VIAGRA) 50 MG tablet Take 1 tablet (50 mg total) by mouth daily as needed for erectile dysfunction. 90 tablet 3  . metoprolol succinate (TOPROL-XL) 100 MG 24 hr tablet Take 1 tablet (100 mg total) by mouth daily. Take with or immediately following a meal. 90 tablet 3   No facility-administered medications prior to visit.      Allergies:   Toradol [ketorolac tromethamine]   Social History   Social History  . Marital status: Married    Spouse name: N/A  . Number of children: 0  . Years of education: 91   Occupational  History  . 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 Smoking status: Current Some Day Smoker    Packs/day: 0.50    Types: Cigarettes  . Smokeless tobacco: Never Used  . Alcohol use Yes     Comment: occasionally  . Drug use: No  . Sexual activity: Yes   Other Topics Concern  . None   Social History Narrative   Lives with wife. Married 14 years. No children     Family History:  The patient's family history includes COPD in his maternal grandfather and maternal grandmother; Cancer in his maternal uncle; Healthy in his sister; Heart attack (age of  onset: 6453) in his mother; Heart attack (age of onset: 4055) in his father; Heart failure in his maternal grandmother; Hypertension in his brother, father, mother, and sister.   ROS:   Please see the history of present illness.    ROS All other systems reviewed and are negative.   PHYSICAL EXAM:   VS:  BP 136/82   Pulse 85   Ht 5\' 11"  (1.803 m)   Wt 233 lb (105.7 kg)   BMI 32.50 kg/m    GEN: Well nourished, well developed, in no acute distress  HEENT: normal  Neck: no JVD, carotid bruits, or masses Cardiac: RRR; no murmurs, rubs, or gallops,no edema  Respiratory:  clear to auscultation bilaterally, normal work of breathing GI: soft, nontender, nondistended, + BS MS: no deformity or atrophy  Skin: warm and dry, no rash Neuro:  Alert and Oriented x 3, Strength and sensation are intact Psych: euthymic mood, full affect  Wt Readings from Last 3 Encounters:  01/19/17 232 lb (105.2 kg)  01/18/17 233 lb (105.7 kg)  12/02/16 231 lb (104.8 kg)      Studies/Labs Reviewed:   EKG:  EKG is ordered today. I have personally reviewed, EKG showed normal sinus rhythm without significant ST-T wave changes.  Recent Labs: 12/07/2016: ALT 34; BUN 12; Creatinine, Ser 1.04; Hemoglobin 15.4; Platelets 357; Potassium 3.3; Sodium 138   Lipid Panel    Component Value Date/Time   CHOL  07/10/2007 0715    144        ATP III CLASSIFICATION:  <200     mg/dL   Desirable  [id number]  mg/dL   Borderline High  >=045>=240    mg/dL   High          TRIG 57 07/10/2007 0715   HDL 44 07/10/2007 0715   CHOLHDL 3.3 07/10/2007 0715   VLDL 11 07/10/2007 0715   LDLCALC  07/10/2007 0715    89        Total Cholesterol/HDL:CHD Risk Coronary Heart Disease Risk Table                     Men   Women  1/2 Average Risk   3.4   3.3  Average Risk       5.0   4.4  2 X Average Risk   9.6   7.1  3 X Average Risk  23.4   11.0        Use the calculated Patient Ratio above and the CHD Risk Table to determine the patient's CHD  Risk.        ATP III CLASSIFICATION (LDL):  <100     mg/dL   Optimal  [id number]  mg/dL   Near or Above  Optimal  130-159  mg/dL   Borderline  161-096  mg/dL   High  >045     mg/dL   Very High    Additional studies/ records that were reviewed today include:   Cath 06/17/2007 by Dr. Algie Coffer  IMPRESSION:  1. 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  2. Normal left ventricular systolic function.   RECOMMENDATIONS:  This patient will continue medical therapy.  He may  use a small dose of calcium channel blocker for his coronary spasm.   ASSESSMENT:    1. Chest pain, unspecified type   2. Encounter for long-term (current) use of medications   3. Essential hypertension   4. Lower abdominal pain   5. Mixed hyperlipidemia      PLAN:  In order of problems listed above:  1. Chest pain: He has been evaluated multiple times in the past for chest pain, this recurrence is associated with exertional shortness of breath as well. Given clean coronary on cardiac catheterization in 2008, relatively low suspicion for coronary artery disease. We discussed the various options, he eventually we agreed to pursue coronary CT as a noninvasive method to rule out coronary artery disease as the cause of his symptom. I did discuss possibility of a trial of medical therapy such as Imdur, unfortunately, due to his erectile dysfunction, sometimes he uses Cialis or Viagra which has interaction. He is on amlodipine 10 mg as previous cardiac catheterization in December 2008 by Dr. Algie Coffer mentioned possible coronary spasm.  2. Hypertension: Blood pressure stable  3. Hyperlipidemia: although he carries a diagnosis of hyperlipidemia, he is not on any statin medication. We'll defer to primary care provider regarding annual lipid panel workup    Medication Adjustments/Labs and Tests Ordered: Current medicines are reviewed at length with the patient today.   Concerns regarding medicines are outlined above.  Medication changes, Labs and Tests ordered today are listed in the Patient Instructions below. Patient Instructions  Medication Instructions:   No changes.   Labwork:   BMET within 1 week of the coronary CT  Testing/Procedures:  Coronary CT  Your physician has requested that you have cardiac CT. Cardiac computed tomography (CT) is a painless test that uses an x-ray machine to take clear, detailed pictures of your heart. For further information please visit [email or web address] Please follow instruction sheet as given.  You will need to get the BMET (electrolyte & kidney function lab test) done within 1 week of the exam. You may get this drawn at our office. You do not need to fast for this test.  On the day of this test, you will need to take your amlodipine, and metoprolol, but do not take your other blood pressure medications (losartan-HCTZ or Bentyl). You may need to get a dose of metoprolol while undergoing the procedure in order to slow your heart rate - we do not want your blood pressure to drop too low.    Follow-Up:  Follow up as scheduled with Dr. Herbie Baltimore after test. We will contact you when FMLA paperwork is ready.  If you need a refill on your cardiac medications before your next appointment, please call your pharmacy.      Ramond Dial, [place]  01/19/2017 6:25 PM    [facility] [address] [place], [place], [place]  [address] Phone: [phone]; Fax: [phone]

## 2017-01-19 ENCOUNTER — Ambulatory Visit (INDEPENDENT_AMBULATORY_CARE_PROVIDER_SITE_OTHER): Payer: 59 | Admitting: Family Medicine

## 2017-01-19 ENCOUNTER — Encounter: Payer: Self-pay | Admitting: Family Medicine

## 2017-01-19 ENCOUNTER — Encounter: Payer: Self-pay | Admitting: Physician Assistant

## 2017-01-19 VITALS — BP 128/86 | HR 88 | Temp 97.2°F | Resp 16 | Ht 71.0 in | Wt 232.0 lb

## 2017-01-19 DIAGNOSIS — E782 Mixed hyperlipidemia: Secondary | ICD-10-CM

## 2017-01-19 DIAGNOSIS — Z720 Tobacco use: Secondary | ICD-10-CM | POA: Diagnosis not present

## 2017-01-19 DIAGNOSIS — I10 Essential (primary) hypertension: Secondary | ICD-10-CM | POA: Diagnosis not present

## 2017-01-19 DIAGNOSIS — K589 Irritable bowel syndrome without diarrhea: Secondary | ICD-10-CM | POA: Diagnosis not present

## 2017-01-19 NOTE — Progress Notes (Signed)
Chief Complaint  Patient presents with  . Follow-up    6 month   No complaint saw cardiology yesterday He had a normal stress echo 6 months ago Still with chest pain at times Is scheduled for a cardiac CT  No more rectal bleeding.  Still with some abdominal pain.  Sees GI next month.  Does not think bentyl helps  Weight stable  BP is great today Compliant with medicine  Still smoking but is confident he can quit on  His own   Patient Active Problem List   Diagnosis Date Noted  . Rectal bleeding 12/02/2016  . Abdominal pain 12/02/2016  . Sinus tachycardia 09/17/2016  . Erectile dysfunction 09/17/2016  . Encounter to obtain excuse from work 09/17/2016  . Essential hypertension 03/23/2016  . Tobacco abuse 03/23/2016  . HLD (hyperlipidemia) 03/23/2016    Outpatient Encounter Prescriptions as of 01/19/2017  Medication Sig  . amLODipine (NORVASC) 10 MG tablet Take 1 tablet (10 mg total) by mouth daily.  Marland Kitchen dicyclomine (BENTYL) 20 MG tablet Take 1 tablet (20 mg total) by mouth every 6 (six) hours. As needed abdominal cramps  . ibuprofen (ADVIL,MOTRIN) 800 MG tablet Take 1 tablet (800 mg total) by mouth 3 (three) times daily.  Marland Kitchen losartan-hydrochlorothiazide (HYZAAR) 100-25 MG tablet Take 1 tablet by mouth daily.  . metoprolol succinate (TOPROL-XL) 100 MG 24 hr tablet Take 1 tablet (100 mg total) by mouth daily. Take with or immediately following a meal.  . Multiple Vitamin (MULTIVITAMIN WITH MINERALS) TABS tablet Take 1 tablet by mouth daily.  . sildenafil (VIAGRA) 50 MG tablet Take 1 tablet (50 mg total) by mouth daily as needed for erectile dysfunction.   No facility-administered encounter medications on file as of 01/19/2017.     Allergies  Allergen Reactions  . Toradol [Ketorolac Tromethamine] Hives    Review of Systems  Constitutional: Negative for chills and fever.  HENT: Negative for congestion and dental problem.   Eyes: Negative for redness and visual  disturbance.  Respiratory: Positive for chest tightness. Negative for cough and shortness of breath.   Cardiovascular: Negative for chest pain, palpitations and leg swelling.  Gastrointestinal: Positive for abdominal pain. Negative for abdominal distention, constipation, diarrhea, nausea and rectal pain.  Genitourinary: Negative for difficulty urinating and flank pain.  Musculoskeletal: Negative for back pain and neck pain.  Neurological: Negative for dizziness and headaches.    BP 128/86 (BP Location: Right Arm, Patient Position: Sitting, Cuff Size: Large)   Pulse 88   Temp (!) 97.2 F (36.2 C) (Temporal)   Resp 16   Ht 5\' 11"  (1.803 m)   Wt 232 lb (105.2 kg)   SpO2 96%   BMI 32.36 kg/m   Physical Exam  Constitutional: He is oriented to person, place, and time. He appears well-developed and well-nourished.  HENT:  Head: Normocephalic and atraumatic.  Mouth/Throat: Oropharynx is clear and moist.  Eyes: Pupils are equal, round, and reactive to light. Conjunctivae are normal.  Neck: Normal range of motion.  Cardiovascular: Normal rate, regular rhythm and normal heart sounds.   Pulmonary/Chest: Effort normal and breath sounds normal. No respiratory distress.  Musculoskeletal: Normal range of motion. He exhibits no edema.  Lymphadenopathy:    He has no cervical adenopathy.  Neurological: He is alert and oriented to person, place, and time.  Gait normal  Skin: Skin is warm and dry.  Psychiatric: His behavior is normal. Thought content normal.  Quiet, somewhat angry appearance  Nursing note and  vitals reviewed.   ASSESSMENT/PLAN:  1. Irritable bowel syndrome without diarrhea Sees GI.  High fiber diet recommended.  2. Essential hypertension controlled  3. Mixed hyperlipidemia diet  4. Tobacco abuse advised to quit   Patient Instructions  Due for flu shot in the fall See cardiology and gastroenterology as scheduled No change in medicines  See me every six  months Call sooner for problems   Eustace MooreYvonne Sue Nelson, MD

## 2017-01-19 NOTE — Patient Instructions (Addendum)
Due for flu shot in the fall See cardiology and gastroenterology as scheduled No change in medicines  See me every six months Call sooner for problems

## 2017-01-20 ENCOUNTER — Telehealth: Payer: Self-pay | Admitting: Family Medicine

## 2017-01-20 NOTE — Telephone Encounter (Signed)
New Message  Pt voiced he dropped off paperwork back in September/October of last year and needing it for his job.  Job accommodation form for [employer].  Please f/u with pt in regards to this form

## 2017-01-21 ENCOUNTER — Encounter: Payer: Self-pay | Admitting: Physician Assistant

## 2017-01-21 ENCOUNTER — Other Ambulatory Visit: Payer: Self-pay | Admitting: Physician Assistant

## 2017-01-21 NOTE — Telephone Encounter (Signed)
I do not have any papers from September, they will need to be refaxed to the office

## 2017-01-22 ENCOUNTER — Encounter: Payer: Self-pay | Admitting: Nurse Practitioner

## 2017-01-22 ENCOUNTER — Ambulatory Visit (INDEPENDENT_AMBULATORY_CARE_PROVIDER_SITE_OTHER): Payer: 59 | Admitting: Nurse Practitioner

## 2017-01-22 VITALS — BP 132/79 | HR 89 | Temp 97.6°F | Ht 71.0 in | Wt 230.2 lb

## 2017-01-22 DIAGNOSIS — K589 Irritable bowel syndrome without diarrhea: Secondary | ICD-10-CM | POA: Diagnosis not present

## 2017-01-22 DIAGNOSIS — K625 Hemorrhage of anus and rectum: Secondary | ICD-10-CM | POA: Diagnosis not present

## 2017-01-22 MED ORDER — LINACLOTIDE 72 MCG PO CAPS: 72.0000 ug | ORAL_CAPSULE | Freq: Every day | ORAL | 0 refills | Status: DC

## 2017-01-22 MED ORDER — HYDROCORTISONE ACETATE 25 MG RE SUPP: 25.0000 mg | Freq: Two times a day (BID) | RECTAL | 1 refills | Status: DC

## 2017-01-22 NOTE — Assessment & Plan Note (Signed)
The patient has historically had rectal bleeding for which he underwent colon[place] in [place] couple months ago. See previous office note and history of present illness for details. He had another episode of blood in his stools which is described as on the outside of the stool and on the toilet tissue. This happened 2 days ago. Based on reports of normal colonoscopy and the type of bleeding he is having it is likely hemorrhoidal in nature. Most likely internal hemorrhoids. At this point I will send an Anusol suppository cream. If he continues to have bleeding he may be a candidate for flexible sigmoidoscopy to evaluate for internal hemorrhoids and consideration of hemorrhoid banding as a treatment option. Return for follow-up in 6 weeks.

## 2017-01-22 NOTE — Patient Instructions (Signed)
1. I am giving the samples of Linzess 72 g. Take this once a day, on an empty stomach. 2. Call us in 1-2 weeks and let us know if it is helping your abdominal pain. 3. I have sent in Anusol suppositories to your pharmacy. This should help with rectal bleeding. He can use them up to twice a day for 10 days at a time. 4. Return for follow-up in 6 weeks. 5. Call us with any questions or concerns.

## 2017-01-22 NOTE — Assessment & Plan Note (Signed)
The patient does not describe over constipation. He states he goes to the bathroom about every other day and feels like he empties completely with no hard stools or straining. However, he later admitted that at times she feels the need to have an urgent bowel movement but when he sits on the commode he is unable to have a bowel movement. He does have persistent ongoing lower abdominal pain. Bentyl is not helping at this time. He may potentially have a touch of irritable bowel syndrome. I will trial him on Linzess 72 g once a day with samples. I will request a progress report in 1-2 weeks. Return for follow-up in 6 weeks to further evaluate his clinical progression.

## 2017-01-22 NOTE — Progress Notes (Signed)
Referring Provider: Eustace MooreNelson, Yvonne Sue, MD Primary Care Physician:  Eustace MooreNelson, Yvonne Sue, MD Primary GI:  Dr. Jena Gaussourk  Chief Complaint  Patient presents with  . Abdominal Pain    HPI:   William Berry is a 41 y.o. male who presents for follow-up on abdominal pain and rectal bleeding. Colonoscopy up-to-date within the past 2 months. He initially was seeking a second opinion because of ongoing symptoms and is laxative satisfaction with his previous GI provider in [place].  Today he states he doesn't think the Bentyl is working for him. Over the last few days having twisting pain, before then was kinda "gripy." 2 days ago he saw some blood in his stools. Has a bowel movement about every other day, pain is worse when he's on the commode trying to use the bathroom. Feels like he empties completely, no loose stools, no straining. Consistent with [facility] even on Bentyl, which he takes once daily. Pain is intermittent and occurs about 7-8 times a day, self-resolves, lasts as long as 5-10 minutes.  He will at times have the abdominal pain, sensation like he has to use the bathroom urgently but not be able to go. Eats a lot of fruit (at least a few servings a day), occasional veggies. Blood a few days ago was on the surface of the stool and on the toilet tissue. Drinks at least 8 glasses of water a day. Denies melena, fever, chills, unintentional weight loss. Saw cardiologist for intermittent chest pain and has coronary CT scheduled for August. Denies dyspnea, dizziness, lightheadedness, syncope, near syncope. Denies any other upper or lower GI symptoms.  Past Medical History:  Diagnosis Date  . Chest pain    a. 10/2003 Cath [facility]): nl cors, pLAd spasm, EF 65%.  . Erectile dysfunction    a. In setting of antihypertensive Rx.  Marland Kitchen. Heart murmur as a child    a. 05/2016 Echo: EF 55-60%, no rwma, mild LAE, trace MR/TR.  Marland Kitchen. Hyperlipidemia   . Hypertension    a. 05/2016 Renal Duplex:  normal renal  arteries.  . Tobacco abuse     Past Surgical History:  Procedure Laterality Date  . CARDIAC CATHETERIZATION      Current Outpatient Prescriptions  Medication Sig Dispense Refill  . amLODipine (NORVASC) 10 MG tablet Take 1 tablet (10 mg total) by mouth daily. 90 tablet 3  . dicyclomine (BENTYL) 20 MG tablet Take 1 tablet (20 mg total) by mouth every 6 (six) hours. As needed abdominal cramps 30 tablet 0  . ibuprofen (ADVIL,MOTRIN) 800 MG tablet Take 1 tablet (800 mg total) by mouth 3 (three) times daily. 21 tablet 0  . losartan-hydrochlorothiazide (HYZAAR) 100-25 MG tablet Take 1 tablet by mouth daily. 90 tablet 3  . Multiple Vitamin (MULTIVITAMIN WITH MINERALS) TABS tablet Take 1 tablet by mouth daily.    . sildenafil (VIAGRA) 50 MG tablet Take 1 tablet (50 mg total) by mouth daily as needed for erectile dysfunction. 90 tablet 3  . metoprolol succinate (TOPROL-XL) 100 MG 24 hr tablet Take 1 tablet (100 mg total) by mouth daily. Take with or immediately following a meal. 90 tablet 3   No current facility-administered medications for this visit.     Allergies as of 01/22/2017 - Review Complete 01/22/2017  Allergen Reaction Noted  . Toradol [ketorolac tromethamine] Hives 10/23/2014    Family History  Problem Relation Age of Onset  . Hypertension Mother   . Heart attack Mother [id number]  . Hypertension Father   .  Heart attack Father [id number]  . Hypertension Sister   . Hypertension Brother   . Healthy Sister   . Cancer Maternal Uncle        bone  . Heart failure Maternal Grandmother   . COPD Maternal Grandmother   . COPD Maternal Grandfather   . Colon cancer Neg Hx   . Colon polyps Neg Hx     Social History   Social History  . Marital status: Married    Spouse name: N/A  . Number of children: 0  . 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 Smoking status: Current Some Day Smoker    Packs/day: 0.50     Types: Cigarettes  . Smokeless tobacco: Never Used  . Alcohol use Yes     Comment: occasionally  . Drug use: No  . Sexual activity: Yes   Other Topics Concern  . None   Social History Narrative   Lives with wife. Married 14 years. No children    Review of Systems: General: Negative for anorexia, weight loss, fever, chills, fatigue, weakness. ENT: Negative for hoarseness, difficulty swallowing. CV: Negative for chest pain, angina, palpitations, peripheral edema.  Respiratory: Negative for dyspnea at rest, cough, sputum, wheezing.  GI: See history of present illness. Endo: Negative for unusual weight change.  Heme: One episode of rectal bleeding.   Physical Exam: BP 132/79   Pulse 89   Temp 97.6 F (36.4 C) (Oral)   Ht 5\' 11"  (1.803 m)   Wt 230 lb 3.2 oz (104.4 kg)   BMI 32.11 kg/m  General:   Alert and oriented. Pleasant and cooperative. Well-nourished and well-developed.  Eyes:  Without icterus, sclera clear and conjunctiva pink.  Ears:  Normal auditory acuity. Cardiovascular:  S1, S2 present without murmurs appreciated. Extremities without clubbing or edema. Respiratory:  Clear to auscultation bilaterally. No wheezes, rales, or rhonchi. No distress.  Gastrointestinal:  +BS, soft, and non-distended. Mild lower abdominal TTP. No HSM noted. No guarding or rebound. No masses appreciated.  Rectal:  Deferred  Musculoskalatal:  Symmetrical without gross deformities. Neurologic:  Alert and oriented x4;  grossly normal neurologically. Psych:  Alert and cooperative. Normal mood and affect. Heme/Lymph/Immune: No excessive bruising noted.    01/22/2017 11:49 AM   Disclaimer: This note was dictated with voice recognition software. Similar sounding words can inadvertently be transcribed and may not be corrected upon review.

## 2017-01-25 ENCOUNTER — Ambulatory Visit: Payer: 59 | Admitting: Nurse Practitioner

## 2017-01-25 ENCOUNTER — Encounter: Payer: Self-pay | Admitting: Internal Medicine

## 2017-01-25 NOTE — Progress Notes (Signed)
CC'ED TO PCP 

## 2017-02-08 ENCOUNTER — Other Ambulatory Visit (HOSPITAL_COMMUNITY)
Admission: RE | Admit: 2017-02-08 | Discharge: 2017-02-08 | Disposition: A | Discharge status: Home / Self Care | Payer: 59 | Source: Ambulatory Visit | Attending: Physician Assistant | Admitting: Physician Assistant

## 2017-02-08 ENCOUNTER — Telehealth: Payer: Self-pay | Admitting: Physician Assistant

## 2017-02-08 ENCOUNTER — Ambulatory Visit (HOSPITAL_COMMUNITY)
Admission: RE | Admit: 2017-02-08 | Discharge: 2017-02-08 | Disposition: A | Discharge status: Home / Self Care | Payer: 59 | Source: Ambulatory Visit | Attending: Physician Assistant | Admitting: Physician Assistant

## 2017-02-08 ENCOUNTER — Telehealth: Payer: Self-pay | Admitting: Internal Medicine

## 2017-02-08 DIAGNOSIS — R079 Chest pain, unspecified: Secondary | ICD-10-CM

## 2017-02-08 DIAGNOSIS — Z79899 Other long term (current) drug therapy: Secondary | ICD-10-CM | POA: Diagnosis not present

## 2017-02-08 DIAGNOSIS — I281 Aneurysm of pulmonary artery: Secondary | ICD-10-CM | POA: Diagnosis not present

## 2017-02-08 LAB — BASIC METABOLIC PANEL
Anion gap: 8 (ref 5–15)
BUN: 11 mg/dL (ref 6–20)
CO2: 25 mmol/L (ref 22–32)
CREATININE: 1.16 mg/dL (ref 0.61–1.24)
Calcium: 9.1 mg/dL (ref 8.9–10.3)
Chloride: 106 mmol/L (ref 101–111)
GFR calc Af Amer: >60 mL/min (ref >60)
GLUCOSE: 102 mg/dL — AB (ref 65–99)
POTASSIUM: 4 mmol/L (ref 3.5–5.1)
SODIUM: 139 mmol/L (ref 135–145)

## 2017-02-08 MED ORDER — IOPAMIDOL (ISOVUE-370) INJECTION 76%
INTRAVENOUS | Status: AC
  Filled 2017-02-08: qty 100

## 2017-02-08 MED ORDER — METOPROLOL TARTRATE 5 MG/5ML IV SOLN
5.0000 mg | INTRAVENOUS | Status: DC | PRN
  Administered 2017-02-08 (×2): 5 mg via INTRAVENOUS
  Filled 2017-02-08: qty 5

## 2017-02-08 MED ORDER — IOPAMIDOL (ISOVUE-370) INJECTION 76%
80.0000 mL | Freq: Once | INTRAVENOUS | Status: AC | PRN
  Administered 2017-02-08: 100 mL via INTRAVENOUS

## 2017-02-08 MED ORDER — NITROGLYCERIN 0.4 MG SL SUBL
0.8000 mg | SUBLINGUAL_TABLET | SUBLINGUAL | Status: DC | PRN
  Administered 2017-02-08: 0.8 mg via SUBLINGUAL
  Filled 2017-02-08: qty 25

## 2017-02-08 MED ORDER — METOPROLOL TARTRATE 5 MG/5ML IV SOLN
INTRAVENOUS | Status: AC
  Administered 2017-02-08: 5 mg
  Filled 2017-02-08: qty 10

## 2017-02-08 MED ORDER — NITROGLYCERIN 0.4 MG SL SUBL
SUBLINGUAL_TABLET | SUBLINGUAL | Status: AC
  Filled 2017-02-08: qty 2

## 2017-02-08 NOTE — Telephone Encounter (Signed)
I dont have any paperwork for this pt. I have checked EG desk and RMR desk and do not see any paperwork.

## 2017-02-08 NOTE — Telephone Encounter (Signed)
Returned call to patient-per chart review:  On the day of this test, you will need to take your amlodipine, and metoprolol, but do not take your other blood pressure medications (losartan-HCTZ or Bentyl). You may need to get a dose of metoprolol while undergoing the procedure in order to slow your heart rate - we do not want your blood pressure to drop too low.   Also reviewed instructions per letter in Epic.   Patient aware and verbalized understanding.

## 2017-02-08 NOTE — Telephone Encounter (Signed)
New Message    Pt went to [facility] to have lab work done and they said they can not see order in computer??   Pt would also like to speak with Dr Herbie BaltimoreHarding nurse about his work paperwork

## 2017-02-08 NOTE — Telephone Encounter (Signed)
New message     What medication is he suppose to hold before the CT today

## 2017-02-08 NOTE — Telephone Encounter (Signed)
[phone]  PLEASE CALL PATIENT REGARDING PAPERWORK HE LEFT TO BE FILLED OUT AT HIS LAST OFFICE VISIT.  HE HAS MISSED A LOT OF WORK AND NEEDS TO TURN THESE PAPERS IN .  HE NEEDS TO SPEAK TO SOMEONE ASAP

## 2017-02-08 NOTE — Telephone Encounter (Signed)
Returned call to patient.   He states the lab tech found lab orders and he will get blood work done today.  Patient inquired about paperwork - he had to take time off d/t HTN when he ran out of BP pills in April 2018. He states Jasmine DecemberSharon, RN told him that she would get Dr. Herbie BaltimoreHarding to sign off on paperwork so he would not get "points" at work. He states he will lose life insurance/health insurance if this is not rectified. He states he dropped the paperwork off in May 2018. Patient notified that there is a note in EPIC that paperwork was completed and picked up to be signed on 7/18. He states he will call in and ask for Larita FifeLynn w/med records to inquire about paperwork.

## 2017-02-10 ENCOUNTER — Telehealth: Payer: Self-pay | Admitting: Physician Assistant

## 2017-02-10 ENCOUNTER — Telehealth: Payer: Self-pay | Admitting: Internal Medicine

## 2017-02-10 NOTE — Telephone Encounter (Signed)
Noted, no further recommendations at this time. 

## 2017-02-10 NOTE — Telephone Encounter (Signed)
Pt was returning call from William Berry that per EG we needed a copy of his job description to be able to complete the forms he had dropped of. 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 I told him to check with his HR department and gave him our fax number. He can be reached at [phone]

## 2017-02-10 NOTE — Telephone Encounter (Signed)
New message    Pt is calling [facility] to [facility].

## 2017-02-11 NOTE — Telephone Encounter (Signed)
Follow Up   Pt calling for results again

## 2017-02-11 NOTE — Telephone Encounter (Signed)
Results and recommendations discussed with patient, who verbalized understanding and thanks.  

## 2017-02-16 ENCOUNTER — Encounter: Payer: Self-pay | Admitting: Cardiology

## 2017-02-16 ENCOUNTER — Ambulatory Visit (INDEPENDENT_AMBULATORY_CARE_PROVIDER_SITE_OTHER): Payer: 59 | Admitting: Cardiology

## 2017-02-16 VITALS — BP 134/94 | HR 90 | Ht 71.0 in | Wt 235.0 lb

## 2017-02-16 DIAGNOSIS — E784 Other hyperlipidemia: Secondary | ICD-10-CM

## 2017-02-16 DIAGNOSIS — R0789 Other chest pain: Secondary | ICD-10-CM | POA: Insufficient documentation

## 2017-02-16 DIAGNOSIS — Z720 Tobacco use: Secondary | ICD-10-CM | POA: Diagnosis not present

## 2017-02-16 DIAGNOSIS — I10 Essential (primary) hypertension: Secondary | ICD-10-CM | POA: Diagnosis not present

## 2017-02-16 DIAGNOSIS — E7849 Other hyperlipidemia: Secondary | ICD-10-CM

## 2017-02-16 NOTE — Progress Notes (Signed)
PCP: [name], MD  Clinic Note: Chief Complaint  Patient presents with  . Follow-up    discuss CT   . Chest Pain  . Hypertension    HPI: William Berry is a 41 y.o. male with a PMH below who presents today for 1 month follow-up to discuss results of CT scan. PMH of HTN with normal renal artery duplex in 05/2016, Erectile dysfunction in the setting of antihypertensive medication and a history of chest pain s/p normal cardiac catheterization in 05/2007. He was last seen on 10/08/2016 with Ward Givens NP, his Toprol-XL was increased 200 mg twice a day. He was also on Hyzaar as well.  William Berry was last seen on 01/18/2017 seen by Azalee Course, PA-C. Been noticing exertional chest discomfort and dyspnea  Recent Hospitalizations: None  Studies Personally Reviewed - (if available, images/films reviewed: From Epic Chart or Care Everywhere)  Coronary calcium score/coronary CTA 02/08/2017: 1. Coronary calcium score of 0. This was 0 percentile for age and sex matched control. 2. Normal coronary origin with right dominance. 3. No evidence of CAD. 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 4. Mildly dilated pulmonary artery measuring 32 mm  Interval History: William Berry returns today for follow-up on hypertension and to discuss results of his stress test. He continues to have persistent chest discomfort along left sternal border and radiating down under his left breast. Seem to be tender to palpation and exacerbated by deep inspiration but not certain arm movements. It is not made worse with just simple exertion or deep breathing. Otherwise he denies any headache or blurred vision. No resting or exertional dyspnea.  No PND, orthopnea or edema. No palpitations, lightheadedness, dizziness, weakness or syncope/near syncope. No TIA/amaurosis fugax symptoms.  No claudication.  ROS: A comprehensive was  performed. Review of Systems  Constitutional: Negative for malaise/fatigue and weight loss.  Respiratory: Negative for cough, shortness of breath and wheezing.   Genitourinary: Negative for hematuria.  Musculoskeletal: Negative for falls, joint pain and myalgias.  Neurological: Negative for dizziness.  Endo/Heme/Allergies: Does not bruise/bleed easily.  All other systems reviewed and are negative.  I have reviewed and (if needed) personally updated the patient's problem list, medications, allergies, past medical and surgical history, social and family history.   Past Medical History:  Diagnosis Date  . Chest pain    a.2005 Cath [facility] nl cors, pLAd spasm, EF 65%; coronary calcium score of 0 (2018)  . Erectile dysfunction    a. In setting of antihypertensive Rx.  Marland Kitchen Heart murmur as a child    a. 05/2016 Echo: EF 55-60%, no rwma, mild LAE, trace MR/TR.  Marland Kitchen Hyperlipidemia   . Hypertension    a. 05/2016 Renal Duplex:  normal renal arteries.  . Tobacco abuse     Past Surgical History:  Procedure Laterality Date  . CARDIAC CATHETERIZATION  06/17/2007   Normal coronaries with proximal LAD spasm. Normal LV systolic function.  Cath 06/17/2007 by Dr. Algie Coffer  1. Normal coronaries with proximal left anterior descending artery spasm.  2. Normal left ventricular systolic function.   Current Meds  Medication Sig  . amLODipine (NORVASC) 10 MG tablet Take 1 tablet (10 mg total) by mouth daily.  Marland Kitchen dicyclomine (BENTYL) 20 MG tablet Take 1 tablet (20 mg total) by mouth every 6 (six) hours. As needed abdominal cramps  . hydrocortisone (ANUSOL-HC) 25 MG suppository Place 1 suppository (25 mg total) rectally  every 12 (twelve) hours.  Marland Kitchen ibuprofen (ADVIL,MOTRIN) 800 MG tablet Take 1 tablet (800 mg total) by mouth 3 (three) times daily.  Marland Kitchen linaclotide (LINZESS) 72 MCG capsule Take 1 capsule (72 mcg total) by mouth daily before breakfast.  . losartan-hydrochlorothiazide (HYZAAR) 100-25 MG tablet  Take 1 tablet by mouth daily.  . Multiple Vitamin (MULTIVITAMIN WITH MINERALS) TABS tablet Take 1 tablet by mouth daily.  . sildenafil (VIAGRA) 50 MG tablet Take 1 tablet (50 mg total) by mouth daily as needed for erectile dysfunction.    Allergies  Allergen Reactions  . Toradol [Ketorolac Tromethamine] Hives    Social History   Social History  . Marital status: Married    Spouse name: N/A  . Number of children: 0  . 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 Smoking status: Current Some Day Smoker    Packs/day: 0.50    Types: Cigarettes  . Smokeless tobacco: Never Used  . Alcohol use Yes     Comment: occasionally  . Drug use: No  . Sexual activity: Yes   Other Topics Concern  . None   Social History Narrative   Lives with wife. Married 14 years. No children    family history includes COPD in his maternal grandfather and maternal grandmother; Cancer in his maternal uncle; Healthy in his sister; Heart attack (age of onset: 23) in his mother; Heart attack (age of onset: 41) in his father; Heart failure in his maternal grandmother; Hypertension in his brother, father, mother, and sister.  Wt Readings from Last 3 Encounters:  02/16/17 235 lb (106.6 kg)  01/22/17 230 lb 3.2 oz (104.4 kg)  01/19/17 232 lb (105.2 kg)    PHYSICAL EXAM BP (!) 134/94   Pulse 90   Ht 5\' 11"  (1.803 m)   Wt 235 lb (106.6 kg)   BMI 32.78 kg/m  Physical Exam  Constitutional: He is oriented to person, place, and time. He appears well-developed and well-nourished. No distress.  Mildly obese. Truncal  HENT:  Head: Normocephalic and atraumatic.  Eyes: EOM are normal.  Neck: No hepatojugular reflux and no JVD present. Carotid bruit is not present.  Cardiovascular: Normal rate, regular rhythm and normal pulses.  PMI is not displaced.  Exam reveals gallop and S4 (Very soft). Exam reveals no friction rub.   No murmur  heard. Pulmonary/Chest: Effort normal and breath sounds normal. No respiratory distress. He has no wheezes. He has no rales.  Abdominal: Soft. Bowel sounds are normal. He exhibits no distension. There is no tenderness.  Musculoskeletal: Normal range of motion. He exhibits edema.  Neurological: He is alert and oriented to person, place, and time.  Psychiatric: He has a normal mood and affect. His behavior is normal. Judgment and thought content normal.  Nursing note and vitals reviewed.    Adult ECG Report n/a  Other studies Reviewed: Additional studies/ records that were reviewed today include:  Recent Labs:   Lab Results  Component Value Date   CREATININE 1.16 02/08/2017   BUN 11 02/08/2017   NA 139 02/08/2017   K 4.0 02/08/2017   CL 106 02/08/2017   CO2 25 02/08/2017    ASSESSMENT / PLAN: Problem List Items Addressed This Visit    Anterior chest wall pain - Primary    Chest wall pain he is describing to me is reproducible on exam along the costochondral Border which  is very consistent with costochondritis. Plan: NSAID taper. (See patient instructions). Take NSAIDs with food and adequate hydration.      Essential hypertension (Chronic)    Well-controlled today on current meds. No change. I would anticipate his blood pressure going up a little bit when he is on his NSAID taper. Would not be overly concerned at this point. Ensure adequate hydration however.      HLD (hyperlipidemia)    Monitored by PCP. The fact that he has no coronary artery calcification with relatively low risk. He is not on any medication at this point      Tobacco abuse    Discussed importance of smoking cessation, he is "thinking about it "         Current medicines are reviewed at length with the patient today. (+/- concerns) None The following changes have been made: None  Patient Instructions   CURRENT MEDICATIONS INSTRUCTION  MAY PURCHASE OVER THE COUNTER  FOR CHEST WALL PAIN --- Take  Ibuprofen taper dose 800 mg three times a day for 3 days 600 mg three times a day for 3 days 400 mg three times a day for 3 days 200 mg three times a day for 3 days THEN STOP NO RESTRICTION NEED FOR WORK.  Your physician wants you to follow-up in 6 MONTH WITH DR HARDING. You will receive a reminder letter in the mail two months in advance. If you don't receive a letter, please call our office to schedule the follow-up appointment.  If you need a refill on your cardiac medications before your next appointment, please call your pharmacy.    Studies Ordered:   No orders of the defined types were placed in this encounter.     Bryan Lemma, M.D., M.S. Interventional Cardiologist   Pager # [phone] Phone # [phone] [address] [place], [place] [address]

## 2017-02-16 NOTE — Patient Instructions (Addendum)
CURRENT MEDICATIONS INSTRUCTION  MAY PURCHASE OVER THE COUNTER  FOR CHEST WALL PAIN --- Take Ibuprofen taper dose 800 mg three times a day for 3 days 600 mg three times a day for 3 days 400 mg three times a day for 3 days 200 mg three times a day for 3 days THEN STOP NO RESTRICTION NEED FOR WORK.  Your physician wants you to follow-up in 6 MONTH WITH DR HARDING. You will receive a reminder letter in the mail two months in advance. If you don't receive a letter, please call our office to schedule the follow-up appointment.  If you need a refill on your cardiac medications before your next appointment, please call your pharmacy.

## 2017-02-18 ENCOUNTER — Encounter: Payer: Self-pay | Admitting: Cardiology

## 2017-02-18 NOTE — Assessment & Plan Note (Signed)
Discussed importance of smoking cessation, he is "thinking about it "

## 2017-02-18 NOTE — Assessment & Plan Note (Signed)
Monitored by PCP. The fact that he has no coronary artery calcification with relatively low risk. He is not on any medication at this point

## 2017-02-18 NOTE — Assessment & Plan Note (Signed)
Well-controlled today on current meds. No change. I would anticipate his blood pressure going up a little bit when he is on his NSAID taper. Would not be overly concerned at this point. Ensure adequate hydration however.

## 2017-02-18 NOTE — Assessment & Plan Note (Signed)
Chest wall pain he is describing to me is reproducible on exam along the costochondral Border which is very consistent with costochondritis. Plan: NSAID taper. (See patient instructions). Take NSAIDs with food and adequate hydration.

## 2017-03-16 ENCOUNTER — Ambulatory Visit: Payer: 59 | Admitting: Internal Medicine

## 2017-03-16 ENCOUNTER — Encounter: Payer: Self-pay | Admitting: Internal Medicine

## 2017-03-16 ENCOUNTER — Telehealth: Payer: Self-pay | Admitting: Internal Medicine

## 2017-03-16 NOTE — Telephone Encounter (Signed)
PATIENT WAS A NO SHOW AND LETTER SENT  °

## 2017-03-17 ENCOUNTER — Ambulatory Visit: Payer: 59 | Admitting: Nurse Practitioner

## 2017-07-28 ENCOUNTER — Ambulatory Visit: Payer: 59 | Admitting: Physician Assistant

## 2017-09-06 ENCOUNTER — Encounter: Payer: Self-pay | Admitting: Family Medicine

## 2017-09-12 ENCOUNTER — Other Ambulatory Visit: Payer: Self-pay | Admitting: Family Medicine

## 2017-09-22 ENCOUNTER — Telehealth: Payer: Self-pay | Admitting: *Deleted

## 2017-09-22 MED ORDER — LOSARTAN POTASSIUM 100 MG PO TABS: 100.0000 mg | ORAL_TABLET | Freq: Every day | ORAL | 3 refills | Status: DC

## 2017-09-22 MED ORDER — HYDROCHLOROTHIAZIDE 25 MG PO TABS: 25.0000 mg | ORAL_TABLET | Freq: Every day | ORAL | 3 refills | Status: DC

## 2017-09-22 NOTE — Telephone Encounter (Signed)
RECEIVED FROM PHARMACY - COMBINATION LOSARATN/HCTZ 100 MG /25 MG IS ON BACKORDER.   SPOKE TO DONNA AT PHARMACY VERBAL ORDER FROM DR [facility]  GIVEN . SPLIT  MEDICATION TO  LOSARTAN 100 MG  - ONE TABLET DAILY #90  X3 REFILLS HCTZ 25 MG ONE TABLET DAILY #90 X 3 REFILLS     CHANGED ON MEDICATION LIST

## 2017-09-22 NOTE — Telephone Encounter (Signed)
PER DR HARDING , IF IN THE FUTURE ,AN ALTERNATE IS NEED  FOR THE LOSARTAN AND HCTZ MAY SWITCH TO  TELMESARTAN /HCTZ 80/12.5 MG

## 2018-01-24 ENCOUNTER — Other Ambulatory Visit: Payer: Self-pay

## 2018-01-24 ENCOUNTER — Encounter (HOSPITAL_COMMUNITY): Payer: Self-pay | Admitting: *Deleted

## 2018-01-24 ENCOUNTER — Emergency Department (HOSPITAL_COMMUNITY)
Admission: EM | Admit: 2018-01-24 | Discharge: 2018-01-24 | Disposition: A | Discharge status: Home / Self Care | Payer: BLUE CROSS/BLUE SHIELD | Attending: Emergency Medicine | Admitting: Emergency Medicine

## 2018-01-24 DIAGNOSIS — M545 Low back pain: Secondary | ICD-10-CM | POA: Diagnosis present

## 2018-01-24 DIAGNOSIS — Z5321 Procedure and treatment not carried out due to patient leaving prior to being seen by health care provider: Secondary | ICD-10-CM | POA: Diagnosis not present

## 2018-01-24 NOTE — ED Triage Notes (Signed)
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

## 2018-01-24 NOTE — ED Notes (Signed)
Pt called X 2 with no answer. 

## 2018-01-25 NOTE — ED Notes (Addendum)
Follow up call made  Has appointment w pcp  Pt states he is feeling some better  01/25/18  1102  s Keesha Pellum rn

## 2018-08-02 ENCOUNTER — Ambulatory Visit: Payer: BLUE CROSS/BLUE SHIELD | Admitting: Cardiology

## 2018-08-04 ENCOUNTER — Encounter: Payer: Self-pay | Admitting: *Deleted

## 2019-01-29 ENCOUNTER — Emergency Department (HOSPITAL_COMMUNITY)
Admission: EM | Admit: 2019-01-29 | Discharge: 2019-01-29 | Disposition: A | Discharge status: Home / Self Care | Payer: BLUE CROSS/BLUE SHIELD | Attending: Emergency Medicine | Admitting: Emergency Medicine

## 2019-01-29 ENCOUNTER — Other Ambulatory Visit: Payer: Self-pay

## 2019-01-29 ENCOUNTER — Encounter (HOSPITAL_COMMUNITY): Payer: Self-pay | Admitting: Emergency Medicine

## 2019-01-29 DIAGNOSIS — F17210 Nicotine dependence, cigarettes, uncomplicated: Secondary | ICD-10-CM | POA: Insufficient documentation

## 2019-01-29 DIAGNOSIS — M5432 Sciatica, left side: Secondary | ICD-10-CM

## 2019-01-29 DIAGNOSIS — Z79899 Other long term (current) drug therapy: Secondary | ICD-10-CM | POA: Insufficient documentation

## 2019-01-29 DIAGNOSIS — I10 Essential (primary) hypertension: Secondary | ICD-10-CM | POA: Insufficient documentation

## 2019-01-29 MED ORDER — PREDNISONE 20 MG PO TABS: 40.0000 mg | ORAL_TABLET | Freq: Every day | ORAL | 0 refills | Status: DC

## 2019-01-29 MED ORDER — HYDROCODONE-ACETAMINOPHEN 5-325 MG PO TABS: 2.0000 | ORAL_TABLET | ORAL | 0 refills | Status: DC | PRN

## 2019-01-29 MED ORDER — DEXAMETHASONE SODIUM PHOSPHATE 10 MG/ML IJ SOLN
10.0000 mg | Freq: Once | INTRAMUSCULAR | Status: AC
  Administered 2019-01-29: 10 mg via INTRAMUSCULAR
  Filled 2019-01-29: qty 1

## 2019-01-29 NOTE — Discharge Instructions (Signed)
Expect 2 - 4 weeks of pain Tylenol for pain If you can take ibuprofen that is better (if you're not allergic) Hydrocodone only for severe pain Prednisone daily for 5 days ER for worsneing weakness / numbness Rest for 2 days

## 2019-01-29 NOTE — ED Provider Notes (Signed)
[facility] EMERGENCY DEPARTMENT Provider Note   CSN: [id number] Arrival date & time: 01/29/19  1936    History   Chief Complaint Chief Complaint  Patient presents with  . Hip Pain    HPI BOOKERT GUZZI is a 43 y.o. male.     HPI  43 y/o male - hx of no chronic or acute low back pain - after a physical interaction with his family member a week ago developed pain in the R buttock which was acute in onset, persistent and worse with trying to bend over - associated with numbness in the R foot laterally - no weakness.  His pain was worse tonight causing him to buckle int eh shower and fall, syncope - came around - has no headache or other c/o - no back pain.  Tylenol without relief. At home the last week.  Past Medical History:  Diagnosis Date  . Chest pain    a.2005 [facility]): nl cors, pLAd spasm, EF 65%; coronary calcium score of 0 (2018)  . Erectile dysfunction    a. In setting of antihypertensive Rx.  Marland Kitchen Heart murmur as a child    a. 05/2016 Echo: EF 55-60%, no rwma, mild LAE, trace MR/TR.  Marland Kitchen Hyperlipidemia   . Hypertension    a. 05/2016 Renal Duplex:  normal renal arteries.  . Tobacco abuse     Patient Active Problem List   Diagnosis Date Noted  . Anterior chest wall pain 02/16/2017  . IBS (irritable bowel syndrome) 01/22/2017  . Rectal bleeding 12/02/2016  . Abdominal pain 12/02/2016  . Sinus tachycardia 09/17/2016  . Erectile dysfunction 09/17/2016  . Encounter to obtain excuse from work 09/17/2016  . Essential hypertension 03/23/2016  . Tobacco abuse 03/23/2016  . HLD (hyperlipidemia) 03/23/2016    Past Surgical History:  Procedure Laterality Date  . CARDIAC CATHETERIZATION  06/17/2007   Normal coronaries with proximal LAD spasm. Normal LV systolic function.        Home Medications    Prior to Admission medications   Medication Sig Start Date End Date Taking? Authorizing Provider  amLODipine (NORVASC) 10 MG tablet TAKE 1 TABLET (10 MG TOTAL)  BY MOUTH DAILY. 09/13/17  Yes Raylene Everts, MD  dicyclomine (BENTYL) 20 MG tablet Take 1 tablet (20 mg total) by mouth every 6 (six) hours. As needed abdominal cramps 11/26/16  Yes Raylene Everts, MD  hydrochlorothiazide (HYDRODIURIL) 25 MG tablet Take 1 tablet (25 mg total) by mouth daily. 09/22/17 01/29/19 Yes Leonie Man, MD  ibuprofen (ADVIL) 200 MG tablet Take 200 mg by mouth every 6 (six) hours as needed for mild pain or moderate pain.   Yes [provider]  linaclotide (LINZESS) 72 MCG capsule Take 1 capsule (72 mcg total) by mouth daily before breakfast. Patient taking differently: Take 72 mcg by mouth daily as needed (for constipation).  01/22/17  Yes Carlis Stable, NP  losartan (COZAAR) 100 MG tablet Take 1 tablet (100 mg total) by mouth daily. 09/22/17 01/29/19 Yes Leonie Man, MD  metoprolol tartrate (LOPRESSOR) 100 MG tablet Take 100 mg by mouth daily.   Yes [provider]  Multiple Vitamin (MULTIVITAMIN WITH MINERALS) TABS tablet Take 1 tablet by mouth daily.   Yes [provider]  HYDROcodone-acetaminophen (NORCO/VICODIN) 5-325 MG tablet Take 2 tablets by mouth every 4 (four) hours as needed for moderate pain. 01/29/19   Noemi Chapel, MD  predniSONE (DELTASONE) 20 MG tablet Take 2 tablets (40 mg total) by  mouth daily. 01/29/19   Eber HongMiller, Illyana Schorsch, MD  sildenafil (VIAGRA) 50 MG tablet Take 1 tablet (50 mg total) by mouth daily as needed for erectile dysfunction. 10/14/16   Creig HinesBerge, Christopher Ronald, NP    Family History Family History  Problem Relation Age of Onset  . Hypertension Mother   . Heart attack Mother [id number]  . Hypertension Father   . Heart attack Father 1955  . Hypertension Sister   . Hypertension Brother   . Healthy Sister   . Cancer Maternal Uncle        bone  . Heart failure Maternal Grandmother   . COPD Maternal Grandmother   . COPD Maternal Grandfather   . Colon cancer Neg Hx   . Colon polyps Neg Hx     Social History Social  History   Tobacco Use  . Smoking status: Current Some Day Smoker    Packs/day: 0.50    Types: Cigarettes  . Smokeless tobacco: Never Used  Substance Use Topics  . Alcohol use: Yes    Comment: occasionally  . Drug use: No     Allergies   Toradol [ketorolac tromethamine]   Review of Systems Review of Systems  Constitutional: Negative for fever.  Respiratory: Negative for cough and shortness of breath.   Cardiovascular: Negative for leg swelling.  Gastrointestinal: Negative for abdominal pain.  Musculoskeletal: Positive for myalgias. Negative for joint swelling.  Skin: Negative for rash and wound.  Neurological: Positive for numbness. Negative for weakness.     Physical Exam Updated Vital Signs BP (!) 167/113 (BP Location: Right Arm)   Pulse 96   Temp 98.2 F (36.8 C) (Oral)   Resp 18   Ht 1.803 m (5\' 11" )   Wt 99.8 kg   SpO2 97%   BMI 30.68 kg/m   Physical Exam Constitutional:      General: He is not in acute distress.    Appearance: He is well-developed. He is not diaphoretic.  HENT:     Head: Normocephalic and atraumatic.  Eyes:     General: No scleral icterus.       Right eye: No discharge.        Left eye: No discharge.     Conjunctiva/sclera: Conjunctivae normal.  Cardiovascular:     Rate and Rhythm: Normal rate and regular rhythm.  Pulmonary:     Effort: Pulmonary effort is normal.     Breath sounds: Normal breath sounds.  Musculoskeletal:     Comments:  No tenderness over the Cervical, Thoracic or Lumbar Spine  Skin:    General: Skin is warm and dry.     Findings: No rash.  Neurological:     Comments: Speech is clear, strength in the UE and LE's are normal at the major muscle groups including the hip, knee and ankles.  Sensation in tact to light touch and pin prick of the bilateral LE's except as below.  Normal reflexes at the knees bilaterally.  Gait antalgic secondary to pain   Has pain with SLR - reproducible Has decreased sensation to the  lateral R foot - no weakness at L4,L5,S1.        ED Treatments / Results  Labs (all labs ordered are listed, but only abnormal results are displayed) Labs Reviewed - No data to display  EKG None  Radiology No results found.  Procedures Procedures (including critical care time)  Medications Ordered in ED Medications  dexamethasone (DECADRON) injection 10 mg (has no administration in time range)  Initial Impression / Assessment and Plan / ED Course  I have reviewed the triage vital signs and the nursing notes.  Pertinent labs & imaging results that were available during my care of the patient were reviewed by me and considered in my medical decision making (see chart for details).        No fever, No IVDU No CA No other red flags - has sciateica  To go vicodin given Decadron here Home on prednisone Pt agreeable.  Final Clinical Impressions(s) / ED Diagnoses   Final diagnoses:  Sciatica, left side    ED Discharge Orders         Ordered    HYDROcodone-acetaminophen (NORCO/VICODIN) 5-325 MG tablet  Every 4 hours PRN     01/29/19 2103    predniSONE (DELTASONE) 20 MG tablet  Daily     01/29/19 2103           Eber HongMiller, Kashon Kraynak, MD 01/29/19 2108

## 2019-01-29 NOTE — ED Triage Notes (Signed)
Pt reports he injured his left hip trying to restrain his nephew x 1 week ago, pt reports pain travels down left leg to foot and 3 toes on left foot are numb, pt reports his leg gave out tonight in the shower and he fell hitting his head, pt reports LOC

## 2019-01-30 MED FILL — Hydrocodone-Acetaminophen Tab 5-325 MG: ORAL | Qty: 6 | Status: AC

## 2019-03-20 ENCOUNTER — Encounter: Payer: Self-pay | Admitting: Cardiology

## 2019-03-20 ENCOUNTER — Ambulatory Visit (INDEPENDENT_AMBULATORY_CARE_PROVIDER_SITE_OTHER): Payer: Self-pay | Admitting: Cardiology

## 2019-03-20 ENCOUNTER — Other Ambulatory Visit: Payer: Self-pay

## 2019-03-20 VITALS — BP 154/102 | HR 90 | Temp 98.1°F | Ht 71.0 in | Wt 226.2 lb

## 2019-03-20 DIAGNOSIS — R000 Tachycardia, unspecified: Secondary | ICD-10-CM

## 2019-03-20 DIAGNOSIS — Z720 Tobacco use: Secondary | ICD-10-CM

## 2019-03-20 DIAGNOSIS — R0789 Other chest pain: Secondary | ICD-10-CM

## 2019-03-20 DIAGNOSIS — I119 Hypertensive heart disease without heart failure: Secondary | ICD-10-CM

## 2019-03-20 MED ORDER — CARVEDILOL 25 MG PO TABS: 25.0000 mg | ORAL_TABLET | Freq: Two times a day (BID) | ORAL | 3 refills | Status: DC

## 2019-03-20 NOTE — Progress Notes (Signed)
PCP: Salley SlaughterBoone, Angela, NP  Clinic Note: Chief Complaint  Patient presents with  . Hospitalization Follow-up    CP - HTN  . Hypertension  . Chest Pain    HPI: William Berry is a 43 y.o. male with a PMH notable for difficult control hypertension as well as atypical chest pain who presents today for ER follow-up.William Grammes.  William Berry was last seen in August 2018.  This was in follow-up from a visit with Azalee CourseHao Meng, PA for chest pain.  He was evaluated with a cardiac CT angiogram which showed a coronary calcium score of 0 and normal coronary arteries.  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 --> He noted persistent chest discomfort radiating under her left breast.  The chest was tender to palpation. ->  Was treated with a short taper ibuprofen.  Did not follow-up as recommended in 6 months.  Recent Hospitalizations:   [facility] room few days ago with chest pain and had significant elevated blood pressure readings.  They had to give him multiple different medication to get his blood pressure down and totally he did see some to get his blood pressure control.  Studies Personally Reviewed - (if available, images/films reviewed: From Epic Chart or Care Everywhere)  No new studies  Interval History: William Berry is here to discuss his recent ER visit where he had some chest pressure but was noted to have a significantly elevated blood pressure over 200 mmHg on the required multiple doses of medications and his blood pressure down.  He does indicate he has been under quite a bit of stress of late with work and social issues with COVID-19 etc.  He says been having off and on chest discomfort pretty much throughout the course of the day.  Worse with deep inspiration.  Worse with certain motions but not necessarily worse with exertion per se.  No exertional dyspnea.  He denies any PND, orthopnea but does have some mild edema.  No sensation of irregular  heartbeats or palpitations except when his blood pressure was high he felt his heart pounding.   No  syncope/near syncope. No TIA/amaurosis fugax symptoms. No melena, hematochezia, hematuria, or epstaxis. No claudication.  ROS: A comprehensive was performed. Review of Systems  Constitutional: Positive for malaise/fatigue. Negative for weight loss.  HENT: Negative for congestion and nosebleeds.   Eyes: Positive for blurred vision.  Respiratory: Positive for shortness of breath.   Cardiovascular: Positive for chest pain and palpitations.  Gastrointestinal: Negative for constipation, heartburn and melena.  Genitourinary: Negative for hematuria.  Musculoskeletal: Negative for falls and joint pain.  Neurological: Negative for dizziness and headaches.  Psychiatric/Behavioral: Negative for depression and memory loss. The patient is nervous/anxious. The patient does not have insomnia.   All other systems reviewed and are negative.  I have reviewed and (if needed) personally updated the patient's problem list, medications, allergies, past medical and surgical history, social and family history.   Past Medical History:  Diagnosis Date  . 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  Marland Kitchen. Erectile dysfunction    a. In setting of antihypertensive Rx.  Marland Kitchen. Heart murmur as a child    a. 05/2016 Echo: EF 55-60%, no rwma, mild LAE, trace MR/TR.  Marland Kitchen. Hyperlipidemia   . Hypertension    a. 05/2016 Renal Duplex:  normal renal arteries.  . Tobacco abuse     Past Surgical History:  Procedure Laterality Date  . CARDIAC CATHETERIZATION  06/17/2007   Normal coronaries with proximal LAD spasm. Normal LV systolic function.    Cardiac CTA August 2018: Coronary calcium score 0.  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  amLODipine (NORVASC) 10 MG tablet TAKE 1 TABLET (10 MG TOTAL) BY MOUTH DAILY.  Marland Kitchen linaclotide (LINZESS) 72 MCG capsule Take 1 capsule (72 mcg total) by mouth daily before breakfast. (Patient taking differently: Take 72 mcg by mouth daily as needed (for constipation). )  . Multiple Vitamin (MULTIVITAMIN WITH MINERALS) TABS tablet Take 1 tablet by mouth daily.  . [DISCONTINUED] metoprolol tartrate (LOPRESSOR) 100 MG tablet Take 100 mg by mouth daily.    Allergies  Allergen Reactions  . Toradol [Ketorolac Tromethamine] Hives    Social History   Tobacco Use  . Smoking status: Current Some Day Smoker    Packs/day: 0.50    Types: Cigarettes  . Smokeless tobacco: Never Used  Substance Use Topics  . Alcohol use: Yes    Comment: occasionally  . Drug use: No   Social History   Social History Narrative   Lives with wife. Married 14 years. No children    family history includes COPD in his maternal grandfather and maternal grandmother; Cancer in his maternal uncle; Healthy in his sister; Heart attack (age of onset: 65) in his mother; Heart attack (age of onset: 49) in his father; Heart failure in his maternal grandmother; Hypertension in his brother, father, mother, and sister.  Wt Readings from Last 3 Encounters:  03/20/19 226 lb 3.2 oz (102.6 kg)  01/29/19 220 lb (99.8 kg)  01/24/18 217 lb (98.4 kg)    PHYSICAL EXAM BP (!) 154/102 (BP Location: Right Arm, Patient Position: Sitting, Cuff Size: Large)   Pulse 90   Temp 98.1 F (36.7 C)   Ht 5\' 11"  (1.803 m)   Wt 226 lb 3.2 oz (102.6 kg)   SpO2 98%   BMI 31.55 kg/m  Physical Exam  Constitutional: He is oriented to person, place, and time. He appears well-developed and well-nourished.  Mildly obese gentleman.  No acute distress.  Well-groomed  HENT:  Head: Normocephalic and atraumatic.  Neck: Normal range of motion. Neck supple. No hepatojugular reflux and no JVD present. Carotid bruit is not present.  Cardiovascular: Normal  rate, regular rhythm, S1 normal, S2 normal and normal pulses. PMI is not displaced. Exam reveals distant heart sounds. Exam reveals no gallop and no friction rub.  No murmur heard. Pulmonary/Chest: He exhibits tenderness (Notable point tenderness along the left sternal border that reproduces pain).  Abdominal: Soft. Bowel sounds are normal. He exhibits no distension. There is no abdominal tenderness. There is no rebound.  Musculoskeletal: Normal range of motion.        General: No deformity or edema.  Neurological: He is alert and oriented to person, place, and time. No cranial nerve deficit.  Psychiatric: He has a normal mood and affect. His behavior is normal. Judgment and thought content normal.  Vitals reviewed.    Adult ECG Report  Rate: 90 ;  Rhythm: normal sinus rhythm and Normal axis, intervals and durations.  Nonspecific ST and T wave changes.;   Narrative Interpretation: Relatively normal EKG.   Other studies Reviewed: Additional studies/ records that were reviewed today include:  Recent Labs: None available-labs from  [facility] not available. Lab Results  Component Value Date   CREATININE 1.16 02/08/2017   BUN 11 02/08/2017   NA 139 02/08/2017   K 4.0 02/08/2017   CL 106 02/08/2017   CO2 25 02/08/2017    ASSESSMENT / PLAN: Problem List Items Addressed This Visit    Sinus tachycardia (Chronic)    He has a pretty high resting heart rate but is only been taking metoprolol once a day so I do not think he is getting the full benefit of that.  I would go to a high dose of carvedilol and have him take it twice daily.      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  Plan: Convert from Lopressor which he actually is only  been taken once a day to carvedilol 25 mg twice daily, continue amlodipine and losartan at current doses along with HCTZ for now.  He may require additional medications, but I think going to a more potent beta-blocker and having him take it appropriately will be the best first option.      Relevant Medications   carvedilol (COREG) 25 MG tablet   Other Relevant Orders   EKG 12-Lead (Completed)   Anterior chest wall pain    Again, his chest pain seems to be reproducible on palpation.  Probably is costochondritis similar to what it was last time.Marland Kitchen.  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  Plan: For now would use PRN NSAIDs for Tylenol, but may need to consider nitrate. He is on high-dose amlodipine for blood pressure and to help against coronary spasm.      Relevant Orders   EKG 12-Lead (Completed)   Tobacco abuse    Still thinking about smoking cessation.  Not ready to quit        He will follow-up with our CV RR hypertension clinic in roughly 2 weeks to reassess.  I will then see him back in roughly 2 months after that.  I spent a total of 23 minutes with the patient and chart review. >  50% of the time was spent in direct patient consultation.   Current medicines are reviewed at length with the patient today.  (+/- concerns) has only been taking metoprolol once daily The following changes have been made:  See below  Patient Instructions  Medication Instructions:   STOP METOPROLOL   START CARVEDILOL 25 MG  ONE TABLET  TWICE DAY  If you need a refill on your cardiac medications before your next appointment, please call your pharmacy.   Lab work:  NIT NEEDED  Testing/Procedures: NOT NEEDED  Follow-Up: At [facility], you and your health needs are our priority.  As part of our continuing mission to provide you  with exceptional heart care, we have created designated Provider Care Teams.  These Care Teams include your primary Cardiologist (physician) and Advanced Practice Providers (APPs -  Physician Assistants and Nurse Practitioners) who all work together to provide you with the care you need, when you need it. . You will need a follow up appointment in 2  months.    You may see Onalee Huaavid  Ellyn Hack, MD  Your physician recommends that you schedule a follow-up appointment in 2 WEEKS IN CVRR - BLOOD PRESSURE  Any Other Special Instructions Will Be Listed Below (If Applicable).  IF YOU ARE UNABLE TO GET  CARVEDILOL TONIGHT TAKE METOPROLOL 100 MG TONIGHT    Studies Ordered:   Orders Placed This Encounter  Procedures  . EKG 12-Lead      Glenetta Hew, M.D., M.S. Interventional Cardiologist   Pager # [phone] Phone # [phone] [address] [place], [place] [address]   Thank you for choosing Heartcare at [facility]!!

## 2019-03-20 NOTE — Patient Instructions (Addendum)
Medication Instructions:   STOP METOPROLOL   START CARVEDILOL 25 MG  ONE TABLET  TWICE DAY  If you need a refill on your cardiac medications before your next appointment, please call your pharmacy.   Lab work:  NIT NEEDED  Testing/Procedures: NOT NEEDED  Follow-Up: At [facility], you and your health needs are our priority.  As part of our continuing mission to provide you with exceptional heart care, we have created designated Provider Care Teams.  These Care Teams include your primary Cardiologist (physician) and Advanced Practice Providers (APPs -  Physician Assistants and Nurse Practitioners) who all work together to provide you with the care you need, when you need it. . You will need a follow up appointment in 2  months.    You may see Glenetta Hew, MD  Your physician recommends that you schedule a follow-up appointment in 2 WEEKS IN CVRR - BLOOD PRESSURE  Any Other Special Instructions Will Be Listed Below (If Applicable).  IF YOU ARE UNABLE TO GET  CARVEDILOL TONIGHT TAKE METOPROLOL 100 MG TONIGHT

## 2019-03-26 ENCOUNTER — Encounter: Payer: Self-pay | Admitting: Cardiology

## 2019-03-27 ENCOUNTER — Encounter: Payer: Self-pay | Admitting: Cardiology

## 2019-03-27 NOTE — Assessment & Plan Note (Signed)
He had pretty significant elevated blood pressure while in the emergency room, blood pressures he tells me at home have been continuously over 355 systolic and over 732 diastolic.  Plan: Convert from Lopressor which he actually is only been taken once a day to carvedilol 25 mg twice daily, continue amlodipine and losartan at current doses along with HCTZ for now.  He may require additional medications, but I think going to a more potent beta-blocker and having him take it appropriately will be the best first option.

## 2019-03-27 NOTE — Assessment & Plan Note (Signed)
Again, his chest pain seems to be reproducible on palpation.  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  Plan: For now would use PRN NSAIDs for Tylenol, but may need to consider nitrate. He is on high-dose amlodipine for blood pressure and to help against coronary spasm.

## 2019-03-27 NOTE — Assessment & Plan Note (Signed)
He has a pretty high resting heart rate but is only been taking metoprolol once a day so I do not think he is getting the full benefit of that.  I would go to a high dose of carvedilol and have him take it twice daily.

## 2019-03-27 NOTE — Assessment & Plan Note (Signed)
Still thinking about smoking cessation.  Not ready to quit

## 2020-03-19 ENCOUNTER — Ambulatory Visit: Payer: Self-pay | Admitting: Cardiology

## 2020-03-25 ENCOUNTER — Other Ambulatory Visit: Payer: Self-pay | Admitting: Cardiology

## 2020-07-09 ENCOUNTER — Telehealth: Payer: Self-pay | Admitting: Cardiology

## 2020-07-09 NOTE — Telephone Encounter (Signed)
Patient was wanting to talk to Dr. Herbie Baltimore to discuss his diagnosis of arrhythmia. Patient reports he is trying to get life insurance and is denied due to arrhythmia. Per chart review patient had tachycardia in January of 2018. Patient is requesting a letter from Dr. Herbie Baltimore stating patient does not have an arrhythmia but instead sinus tachycardia. Will route to MD and primary RN for review.

## 2020-07-09 NOTE — Telephone Encounter (Signed)
Patient called and wanted to speak with Dr. Caprice Red, [place], [place]. Wanted to also see who gave him the diagnosis of arrthymia. Please call back

## 2020-07-11 NOTE — Telephone Encounter (Signed)
I have reviewed my notes => I never listed arrhythmia as a diagnosis.   My note clearly says Sinus Tachycardia.  Not sure where the Arrhythmia diagnosis comes from.  ?? I guess we can "clarify with a letter?  Bryan Lemma, MD

## 2020-07-12 ENCOUNTER — Encounter: Payer: Self-pay | Admitting: Cardiology

## 2020-07-12 DIAGNOSIS — R000 Tachycardia, unspecified: Secondary | ICD-10-CM

## 2020-07-12 DIAGNOSIS — R0789 Other chest pain: Secondary | ICD-10-CM

## 2020-07-12 DIAGNOSIS — Q245 Malformation of coronary vessels: Secondary | ICD-10-CM | POA: Insufficient documentation

## 2020-07-12 NOTE — Progress Notes (Signed)
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 (Chronic)   Encounter to obtain excuse from work      Bryan Lemma

## 2020-07-12 NOTE — Telephone Encounter (Signed)
Called and spoke to patient. Informed patient letter is ready for pick up.  patient states just place in in the mail for him.  RN placed letter in the [place] mail

## 2020-07-12 NOTE — Telephone Encounter (Signed)
Spoke to patient - information relayed to patient. Patient states she went to primary yesterday to confirm that the diagnosis was invalid.  patient states the person that is helping with life insurance -  - the insurance company pulled a report called MIB report - the report indicate the diagnosis on the patient information  patient is doing appeal to dispute that diagnosis.  patient states he needs a letter from Dr Herbie Baltimore and primary  Sating what diagnosis they listed and never used diagnosis for arrhythmia..   patient states he needs letter by  07/18/20 if possible. RN informed patient will  Defer to Dr Herbie Baltimore  For dictated letter

## 2020-07-12 NOTE — Telephone Encounter (Signed)
Letter dictated.  DH

## 2020-08-11 ENCOUNTER — Other Ambulatory Visit: Payer: Self-pay | Admitting: Cardiology

## 2020-08-13 ENCOUNTER — Telehealth: Payer: Self-pay | Admitting: Cardiology

## 2020-08-13 NOTE — Telephone Encounter (Signed)
Spoke with patient of Dr. Herbie Baltimore who c/o HTN Was seen in [place] ED yesterday  Present meds:  Losartan 25mg  x2 Carvedilol 25mg  BID Amlodipine 10mg  QD  Patient has an appointment 08/15/20 with MD BP readings noted below He will check BP periodically between now and visit and bring readings

## 2020-08-13 NOTE — Telephone Encounter (Signed)
Pt c/o BP issue: STAT if pt c/o blurred vision, one-sided weakness or slurred speech  1. What are your last 5 BP readings? 196/126 yesterday around 4:30 pm, 176/100 after 5pm yesterday went to ED, 179/100 in the ED, today at pharmacy 176/107  2. Are you having any other symptoms (ex. Dizziness, headache, blurred vision, passed out)? Whites of eyes red  3. What is your BP issue? Patient states his BP has been very high and he does not feel well. He states he went to his PCP and they changed his losartan from 1 tablet to 2 tablets daily. He states he is not having any dizziness or blurred vision, but the whites of his eyes are red.

## 2020-08-15 ENCOUNTER — Ambulatory Visit (INDEPENDENT_AMBULATORY_CARE_PROVIDER_SITE_OTHER): Payer: 59 | Admitting: Cardiology

## 2020-08-15 ENCOUNTER — Encounter: Payer: Self-pay | Admitting: Cardiology

## 2020-08-15 ENCOUNTER — Other Ambulatory Visit: Payer: Self-pay

## 2020-08-15 VITALS — BP 166/110 | HR 85 | Ht 71.0 in | Wt 232.0 lb

## 2020-08-15 DIAGNOSIS — R000 Tachycardia, unspecified: Secondary | ICD-10-CM

## 2020-08-15 DIAGNOSIS — I119 Hypertensive heart disease without heart failure: Secondary | ICD-10-CM | POA: Diagnosis not present

## 2020-08-15 DIAGNOSIS — Q245 Malformation of coronary vessels: Secondary | ICD-10-CM

## 2020-08-15 MED ORDER — CARVEDILOL 25 MG PO TABS: ORAL_TABLET | ORAL | 0 refills | Status: DC

## 2020-08-15 MED ORDER — IRBESARTAN-HYDROCHLOROTHIAZIDE 150-12.5 MG PO TABS: 1.0000 | ORAL_TABLET | Freq: Every day | ORAL | 5 refills | Status: DC

## 2020-08-15 NOTE — Telephone Encounter (Signed)
PATIENT WAS SEEN AT OFFICE APPOINTMENT 08/15/20 WITH DR Herbie Baltimore

## 2020-08-15 NOTE — Patient Instructions (Signed)
Medication Instructions:   stop taking Losartan  Start taking  Irbesartan- hctz -150/12.5 mg  One tablet daily    Increase Carvedilol to 25 mg in the morning  And 37.5 mg ( 1 and 1/2 tablets)  in the evening     *If you need a refill on your cardiac medications before your next appointment, please call your pharmacy*   Lab Work: Bmp today  BMP in 2 weeks   If you have labs (blood work) drawn today and your tests are completely normal, you will receive your results only by: Marland Kitchen MyChart Message (if you have MyChart) OR . A paper copy in the mail If you have any lab test that is abnormal or we need to change your treatment, we will call you to review the results.   Testing/Procedures:  Not needed  Follow-Up: At [facility], you and your health needs are our priority.  As part of our continuing mission to provide you with exceptional heart care, we have created designated Provider Care Teams.  These Care Teams include your primary Cardiologist (physician) and Advanced Practice Providers (APPs -  Physician Assistants and Nurse Practitioners) who all work together to provide you with the care you need, when you need it.     Your next appointment:    2 to 3 week(s)  The format for your next appointment:   In Person  Provider:   Bryan Lemma, MD   Other Instructions

## 2020-08-15 NOTE — Progress Notes (Signed)
Primary Care Provider: Arsenio Katz, NP Cardiologist: Glenetta Hew, MD Electrophysiologist: None  Clinic Note: Chief Complaint  Patient presents with  . Hospitalization Follow-up    ER visit with elevated blood pressure.  Delayed follow-up.   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  Plan:   Increase carvedilol to 1.5 tab twice daily-(supposed to be taking 2 tabs twice daily; may need to titrate the rest of the way up)  Convert losartan to irbesartan-HCTZ and increase to 150 mg - 12.5 mg   Check BMP today and then again in 2 weeks.  2-week follow up  Needs home BP cuff      Relevant Medications   irbesartan-hydrochlorothiazide (AVALIDE) 150-12.5 MG tablet   carvedilol (COREG) 25 MG tablet   Other Relevant Orders   Basic metabolic panel   Basic metabolic panel (Completed)   Sinus tachycardia (Chronic)    Heart rate is better.  Heart rate is better.  We have room to titrate carvedilol further.      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 (Chronic)    No symptoms of angina.  The twinges in his chest may be if he is having palpitations or potentially related to hypertension.  I do think we probably will increase his carvedilol to 50 mg twice daily at next visit, and potentially titrate up irbesartan      Relevant Medications   irbesartan-hydrochlorothiazide (AVALIDE) 150-12.5 MG tablet   carvedilol (COREG) 25 MG tablet      ===================================  HPI:    William Berry is a 45 y.o. male with a PMH notable for Accelerated/Poorly Controlled Hypertension along with atypical  chest pain and palpitations who presents today for delayed follow-up.  William Berry was last seen on 03/20/2019 as an ER follow-up after presenting with atypical chest pain and elevated blood pressure.  This was the first incident since August 2018. -> Initial Cardiac Evaluation with Coronary CT Angiogram showing clinical score 0 normal coronary arteries with some intramyocardial bridging.    He is noting musculoskeletal chest pain-worse with inspiration, but not with exertion and no associated dyspnea.  Also notes palpitations.  Was very anxious..  Recent Hospitalizations:   08/12/2020: [facility] Emergency Department --> presented with chest pain blood pressure.  BP was 181/100 mmHg.  According to this note taking 50 mg twice daily of carvedilol 10 mg amlodipine, but was not on losartan but apparently had increased to twice daily. => He decided to go to the [employer] store to get his medications check his blood pressure.  His blood pressure was 190/107, so he went to the ER.  Described the chest pain as a twinge in his chest.  With use of a appropriately sized cuff, blood pressure was 179/108 => postop recommendation carvedilol pulse was 100) supposed to follow-up with PCP.  He then left before his discharge papers  Reviewed  CV studies:    The following studies were reviewed today: (if available, images/films reviewed: From Epic Chart or Care Everywhere) . None:   Interval History:   William Berry presents here today for ER follow-up.  Unfortunately, I do not think his medication list was accurately  reviewed.  He says he has he is taking 25 mg twice daily carvedilol.  Also says that he is taking 25 or 50 mg of losartan.  He is not really having any chest pain or pressure.  BP low no headache no real blurred vision.  No dizziness or wooziness.  No significant dyspnea.  Still feels as though palpitation symptoms where he feels like a roller coaster sensation in his chest lasting a few  seconds.  CV Review of Symptoms (Summary) Cardiovascular ROS: no chest pain or dyspnea on exertion positive for - irregular heartbeat and "feels like a rollercoaster" negative for - edema, loss of consciousness, orthopnea, paroxysmal nocturnal dyspnea, rapid heart rate, shortness of breath or HA/ lightheaded, dizzy/woozy, syncope/near syncope; TIUA/amaurosis fugax; claudication.  The patient does not have symptoms concerning for COVID-19 infection (fever, chills, cough, or new shortness of breath).   REVIEWED OF SYSTEMS   Review of Systems  Constitutional: Negative for malaise/fatigue and weight loss.  HENT: Negative for congestion and sinus pain.   Eyes: Negative for blurred vision and double vision.  Respiratory: Negative for cough and sputum production.   Gastrointestinal: Negative for abdominal pain, blood in stool and melena.  Genitourinary: Negative for frequency and hematuria.  Musculoskeletal: Negative for falls and joint pain.  Neurological: Positive for headaches (maybe occasional mild). Negative for dizziness.  Psychiatric/Behavioral: Negative for depression and memory loss. The patient is not nervous/anxious and does not have insomnia.    I have reviewed and (if needed) personally updated the patient's problem list, medications, allergies, past medical and surgical history, social and family history.   PAST MEDICAL HISTORY   Past Medical History:  Diagnosis Date  . Chest pain    a.2005 [facility]): nl cors, pLAd spasm, EF 65%; coronary calcium score of 0 (2018) -> 27 mm mid LAD myocardial bridging but no significant CAD.  Marland Kitchen Erectile dysfunction    a. In setting of antihypertensive Rx.  Marland Kitchen Heart murmur as a child    a. 05/2016 Echo: EF 55-60%, no rwma, mild LAE, trace MR/TR.  Marland Kitchen Hyperlipidemia   . Hypertension    a. 05/2016 Renal Duplex:  normal renal arteries.  . Tobacco abuse     PAST SURGICAL HISTORY   Past Surgical History:  Procedure Laterality Date  .  CARDIAC CATHETERIZATION  06/17/2007   Normal coronaries with proximal LAD spasm. Normal LV systolic function.    Cardiac CTA August 2018: Coronary calcium score 0.  Normal coronaries with 27 mm long mid LAD intramyocardial bridging, but no CAD  Immunization History  Administered Date(s) Administered  . Influenza,inj,Quad PF,6+ Mos 03/23/2016  . Tdap 04/22/2016    MEDICATIONS/ALLERGIES   Current Meds  Medication Sig  . amLODipine (NORVASC) 10 MG tablet TAKE 1 TABLET (10 MG TOTAL) BY MOUTH DAILY.  Marland Kitchen eszopiclone (LUNESTA) 2 MG TABS tablet Take 2 mg by mouth at bedtime as needed.  . irbesartan-hydrochlorothiazide (AVALIDE) 150-12.5 MG tablet Take 1 tablet by mouth daily.  . Multiple Vitamin (MULTIVITAMIN WITH MINERALS) TABS tablet Take 1 tablet by mouth daily.  . sildenafil (VIAGRA) 50 MG tablet Take 1 tablet (50 mg total) by mouth daily as needed for erectile dysfunction.  . [DISCONTINUED] carvedilol (COREG) 25 MG tablet TAKE 1 TABLET BY MOUTH TWICE A DAY  . [DISCONTINUED] losartan (COZAAR) 25 MG tablet Take 50 mg by mouth daily.    Allergies  Allergen Reactions  . Toradol [Ketorolac Tromethamine] Hives    SOCIAL HISTORY/FAMILY HISTORY   Reviewed in  Epic:  Pertinent findings:  Social History   Tobacco Use  . Smoking status: Former Smoker    Packs/day: 0.50    Types: Cigarettes    Quit date: 08/01/2018    Years since quitting: 2.0  . Smokeless tobacco: Never Used  Vaping Use  . Vaping Use: Never used  Substance Use Topics  . Alcohol use: Yes    Comment: occasionally  . Drug use: No   Social History   Social History Narrative   Lives with wife. Married 14 years. No children    OBJCTIVE -PE, EKG, labs   Wt Readings from Last 3 Encounters:  08/15/20 232 lb (105.2 kg)  03/20/19 226 lb 3.2 oz (102.6 kg)  01/29/19 220 lb (99.8 kg)    Physical Exam: BP (!) 166/110 (BP Location: Left Arm, Patient Position: Sitting)   Pulse 85   Ht $R'5\' 11"'vs$  (1.803 m)   Wt 232 lb  (105.2 kg)   SpO2 96%   BMI 32.36 kg/m  Physical Exam Constitutional:      General: He is not in acute distress.    Appearance: Normal appearance. He is obese. He is not toxic-appearing or diaphoretic.     Comments: Notably heavier than last visit.  Well-groomed  HENT:     Head: Normocephalic and atraumatic.  Neck:     Vascular: No carotid bruit, hepatojugular reflux or JVD.  Cardiovascular:     Rate and Rhythm: Normal rate and regular rhythm. Occasional extrasystoles are present.    Chest Wall: PMI is not displaced.     Pulses: Normal pulses.     Heart sounds: No murmur heard. No friction rub. Gallop present. S4 sounds present.   Pulmonary:     Effort: Pulmonary effort is normal. No respiratory distress.     Breath sounds: Normal breath sounds.  Chest:     Chest wall: No tenderness.  Abdominal:     General: Abdomen is flat. Bowel sounds are normal. There is no distension.     Palpations: Abdomen is soft. There is no mass (No HSM or bruit).     Tenderness: There is no abdominal tenderness.  Musculoskeletal:        General: Swelling (Trivial) present. Normal range of motion.     Cervical back: Normal range of motion and neck supple.  Skin:    General: Skin is warm and dry.  Neurological:     General: No focal deficit present.     Mental Status: He is alert and oriented to person, place, and time. Mental status is at baseline.     Motor: No weakness.     Gait: Gait normal.  Psychiatric:        Mood and Affect: Mood normal.        Behavior: Behavior normal.        Thought Content: Thought content normal.        Judgment: Judgment normal.     Adult ECG Report  Rate: 85 ;  Rhythm: normal sinus rhythm and Nonspecific ST and T wave changes.  Borderline LVH.;   Narrative Interpretation: Stable  Recent Labs: Reviewed, but none since [place] Related to Basic Metabolic Panel Component 47/09/62 02/03/19  eGFR If Africn Am > 60  > 60   Potassium 3.9 3.2Low   Glucose 97 116High  Calcium 9.4 9.3  Sodium 139 140  Chloride 105 104  BUN 11 11  Creatinine 0.89 0.89   ==================================================  COVID-19 Education: The signs and  symptoms of COVID-19 were discussed with the patient and how to seek care for testing (follow up with PCP or arrange E-visit).   The importance of social distancing and COVID-19 vaccination was discussed today. The patient is practicing social distancing & Masking.   I spent a total of 29 minutes with the patient spent in direct patient consultation.  Additional time spent with chart review  / charting (studies, outside notes, etc): 15 min Total Time: 44 min  Current medicines are reviewed at length with the patient today.  (+/- concerns) N/A  This visit occurred during the SARS-CoV-2 public health emergency.  Safety protocols were in place, including screening questions prior to the visit, additional usage of staff PPE, and extensive cleaning of exam room while observing appropriate contact time as indicated for disinfecting solutions.  Notice: This dictation was prepared with Dragon dictation along with smaller phrase technology. Any transcriptional errors that result from this process are unintentional and may not be corrected upon review.  Patient Instructions / Medication Changes & Studies & Tests Ordered   Patient Instructions  Medication Instructions:   stop taking Losartan  Start taking  Irbesartan- hctz -150/12.5 mg  One tablet daily    Increase Carvedilol to 25 mg in the morning  And 37.5 mg ( 1 and 1/2 tablets)  in the evening     *If you need a refill on your cardiac medications before your next appointment, please call your pharmacy*   Lab Work: Bmp today  BMP in 2 weeks   If you have labs (blood work) drawn today and your tests are completely normal, you will receive your results only by: Marland Kitchen MyChart Message (if you have MyChart) OR . A paper copy in the mail If you  have any lab test that is abnormal or we need to change your treatment, we will call you to review the results.   Testing/Procedures:  Not needed  Follow-Up: At [facility], you and your health needs are our priority.  As part of our continuing mission to provide you with exceptional heart care, we have created designated Provider Care Teams.  These Care Teams include your primary Cardiologist (physician) and Advanced Practice Providers (APPs -  Physician Assistants and Nurse Practitioners) who all work together to provide you with the care you need, when you need it.     Your next appointment:    2 to 3 week(s)  The format for your next appointment:   In Person  Provider:   Glenetta Hew, MD   Other Instructions     Studies Ordered:   Orders Placed This Encounter  Procedures  . Basic metabolic panel  . Basic metabolic panel  . EKG 12-Lead     Glenetta Hew, M.D., M.S. Interventional Cardiologist   Pager # [phone] Phone # [phone] [address]. [place], [place] [address]   Thank you for choosing Heartcare at [facility]!!

## 2020-08-16 LAB — BASIC METABOLIC PANEL
BUN/Creatinine Ratio: 13 (ref 9–20)
BUN: 11 mg/dL (ref 6–24)
CO2: 19 mmol/L — ABNORMAL LOW (ref 20–29)
Calcium: 9.3 mg/dL (ref 8.7–10.2)
Chloride: 106 mmol/L (ref 96–106)
Creatinine, Ser: 0.87 mg/dL (ref 0.76–1.27)
GFR calc Af Amer: 122 mL/min/{1.73_m2} (ref >59)
GFR calc non Af Amer: 106 mL/min/{1.73_m2} (ref >59)
Glucose: 98 mg/dL (ref 65–99)
Potassium: 4.2 mmol/L (ref 3.5–5.2)
Sodium: 143 mmol/L (ref 134–144)

## 2020-08-17 ENCOUNTER — Encounter: Payer: Self-pay | Admitting: Cardiology

## 2020-08-17 NOTE — Assessment & Plan Note (Signed)
Heart rate is better.  Heart rate is better.  We have room to titrate carvedilol further.

## 2020-08-17 NOTE — Assessment & Plan Note (Addendum)
No symptoms of angina.  The twinges in his chest may be if he is having palpitations or potentially related to hypertension.  I do think we probably will increase his carvedilol to 50 mg twice daily at next visit, and potentially titrate up irbesartan

## 2020-08-17 NOTE — Assessment & Plan Note (Addendum)
Blood pressures still pretty high today.  Plan:   Increase carvedilol to 1.5 tab twice daily-(supposed to be taking 2 tabs twice daily; may need to titrate the rest of the way up)  Convert losartan to irbesartan-HCTZ and increase to 150 mg - 12.5 mg   Check BMP today and then again in 2 weeks.  2-week follow up  Needs home BP cuff

## 2020-09-04 ENCOUNTER — Other Ambulatory Visit (HOSPITAL_COMMUNITY)
Admission: RE | Admit: 2020-09-04 | Discharge: 2020-09-04 | Disposition: A | Discharge status: Home / Self Care | Payer: 59 | Source: Ambulatory Visit | Attending: Cardiology | Admitting: Cardiology

## 2020-09-04 DIAGNOSIS — I119 Hypertensive heart disease without heart failure: Secondary | ICD-10-CM | POA: Insufficient documentation

## 2020-09-04 DIAGNOSIS — R000 Tachycardia, unspecified: Secondary | ICD-10-CM | POA: Diagnosis present

## 2020-09-04 LAB — BASIC METABOLIC PANEL
Anion gap: 8 (ref 5–15)
BUN: 12 mg/dL (ref 6–20)
CO2: 23 mmol/L (ref 22–32)
Calcium: 8.7 mg/dL — ABNORMAL LOW (ref 8.9–10.3)
Chloride: 107 mmol/L (ref 98–111)
Creatinine, Ser: 0.93 mg/dL (ref 0.61–1.24)
GFR, Estimated: >60 mL/min (ref >60)
Glucose, Bld: 115 mg/dL — ABNORMAL HIGH (ref 70–99)
Potassium: 3.4 mmol/L — ABNORMAL LOW (ref 3.5–5.1)
Sodium: 138 mmol/L (ref 135–145)

## 2020-09-06 ENCOUNTER — Other Ambulatory Visit: Payer: Self-pay

## 2020-09-06 ENCOUNTER — Ambulatory Visit (INDEPENDENT_AMBULATORY_CARE_PROVIDER_SITE_OTHER): Payer: 59 | Admitting: Pharmacist

## 2020-09-06 VITALS — BP 132/86 | HR 88

## 2020-09-06 DIAGNOSIS — I10 Essential (primary) hypertension: Secondary | ICD-10-CM | POA: Diagnosis not present

## 2020-09-06 NOTE — Patient Instructions (Addendum)
Return for a  follow up appointment in 6 weeks  Check your blood pressure at home daily (if able) and keep record of the readings.  Take your BP meds as follows: *NO MEDICATION CHANGE*  Bring all of your meds, your BP cuff and your record of home blood pressures to your next appointment.  Exercise as you're able, try to walk approximately 30 minutes per day.  Keep salt intake to a minimum, especially watch canned and prepared boxed foods.  Eat more fresh fruits and vegetables and fewer canned items.  Avoid eating in fast food restaurants.    HOW TO TAKE YOUR BLOOD PRESSURE: . Rest 5 minutes before taking your blood pressure. .  Don't smoke or drink caffeinated beverages for at least 30 minutes before. . Take your blood pressure before (not after) you eat. . Sit comfortably with your back supported and both feet on the floor (don't cross your legs). . Elevate your arm to heart level on a table or a desk. . Use the proper sized cuff. It should fit smoothly and snugly around your bare upper arm. There should be enough room to slip a fingertip under the cuff. The bottom edge of the cuff should be 1 inch above the crease of the elbow. . Ideally, take 3 measurements at one sitting and record the average.

## 2020-09-06 NOTE — Progress Notes (Signed)
Patient ID: William Berry                 DOB: March 20, 1976                      MRN: [id number]     HPI:  William Berry is a 45 y.o. male referred by Dr. Herbie Baltimore to HTN clinic. PMH includes accelerated hypertension, atypical chest pain, palpitations, ED, and hyperlipidemia.  Patient denies headaches, dizziness, chest pain, or chest tightness.   Current HTN meds:  Amlodipine 10mg  daily - morning Carvedilol 25mg  twice daily - 1 tab in AM (10am) and 1.5 in PM (9pm) Irbesartan/HCT 150-12.5mg  daily - morning  BP goal: 130/80  Family History: HTN in mother & father, granmother  Social History: former smoker, occasional alcohol use  Diet: mainly home cooked meals, accent or any other "complete" mix. Eats out 1-2 times per week.  Exercise: activities of daily living (work mainly)  Home BP readings: no machine  Wt Readings from Last 3 Encounters:  08/15/20 232 lb (105.2 kg)  03/20/19 226 lb 3.2 oz (102.6 kg)  01/29/19 220 lb (99.8 kg)   BP Readings from Last 3 Encounters:  09/06/20 132/86  08/15/20 (!) 166/110  03/20/19 (!) 154/102   Pulse Readings from Last 3 Encounters:  09/06/20 88  08/15/20 85  03/20/19 90    Past Medical History:  Diagnosis Date  . Chest pain    a.2005 [facility]): nl cors, pLAd spasm, EF 65%; coronary calcium score of 0 (2018) -> 27 mm mid LAD myocardial bridging but no significant CAD.  [facility] Erectile dysfunction    a. In setting of antihypertensive Rx.  07-27-1978 Heart murmur as a child    a. 05/2016 Echo: EF 55-60%, no rwma, mild LAE, trace MR/TR.  Marland Kitchen Hyperlipidemia   . Hypertension    a. 05/2016 Renal Duplex:  normal renal arteries.  . Tobacco abuse     Current Outpatient Medications on File Prior to Visit  Medication Sig Dispense Refill  . amLODipine (NORVASC) 10 MG tablet TAKE 1 TABLET (10 MG TOTAL) BY MOUTH DAILY. 90 tablet 0  . carvedilol (COREG) 25 MG tablet Take 25 mg in the morning and 37.5 mg ( 1 and 1/2 table) in the evening. 180 tablet  0  . eszopiclone (LUNESTA) 2 MG TABS tablet Take 2 mg by mouth at bedtime as needed.    . irbesartan-hydrochlorothiazide (AVALIDE) 150-12.5 MG tablet Take 1 tablet by mouth daily. 30 tablet 5  . Multiple Vitamin (MULTIVITAMIN WITH MINERALS) TABS tablet Take 1 tablet by mouth daily.    . sildenafil (VIAGRA) 50 MG tablet Take 1 tablet (50 mg total) by mouth daily as needed for erectile dysfunction. 90 tablet 3   No current facility-administered medications on file prior to visit.    Allergies  Allergen Reactions  . Toradol [Ketorolac Tromethamine] Hives    Blood pressure 132/86, pulse 88, SpO2 98 %.  Essential hypertension Blood pressure remains slithly above goal, but is better controlled from previous OV. Patient reports compliance with current medication therapy, and denies ADRs. Will continue medication as prescribed, and work on positive lifestyle modification. Plan to follow up in 6 weeks and increase irbesartan to 300mg  if addiotional BP control needed.  Rjay Revolorio Rodriguez-Guzman PharmD, BCPS, CPP [facility] [address] [place] [facility] 09/17/2020 1:26 PM

## 2020-09-17 ENCOUNTER — Encounter: Payer: Self-pay | Admitting: Pharmacist

## 2020-09-17 NOTE — Assessment & Plan Note (Signed)
Blood pressure remains slithly above goal, but is better controlled from previous OV. Patient reports compliance with current medication therapy, and denies ADRs. Will continue medication as prescribed, and work on positive lifestyle modification. Plan to follow up in 6 weeks and increase irbesartan to 300mg  if addiotional BP control needed.

## 2020-09-20 ENCOUNTER — Telehealth: Payer: Self-pay | Admitting: *Deleted

## 2020-09-20 ENCOUNTER — Encounter: Payer: Self-pay | Admitting: *Deleted

## 2020-09-20 ENCOUNTER — Other Ambulatory Visit: Payer: Self-pay | Admitting: *Deleted

## 2020-09-20 DIAGNOSIS — Z79899 Other long term (current) drug therapy: Secondary | ICD-10-CM

## 2020-09-20 DIAGNOSIS — I119 Hypertensive heart disease without heart failure: Secondary | ICD-10-CM

## 2020-09-20 DIAGNOSIS — I10 Essential (primary) hypertension: Secondary | ICD-10-CM

## 2020-09-20 MED ORDER — POTASSIUM CHLORIDE CRYS ER 20 MEQ PO TBCR: 20.0000 meq | EXTENDED_RELEASE_TABLET | Freq: Every day | ORAL | 3 refills | Status: DC

## 2020-09-20 NOTE — Telephone Encounter (Signed)
Called left detail message on voicemail ,  also information released to MyChart.  20 meq potassium daily . Once starting  Supplement have labs rechecked in 2 weeks . Please have labs done prior to your appointment with [facility] in April . Any question may call back   orders placed

## 2020-09-20 NOTE — Telephone Encounter (Signed)
-----   Message from Marykay Lex, MD sent at 09/06/2020  9:37 PM EST ----- Labs look okay.  Potassium level has gone down with starting HCTZ.  I would like to continue the HCTZ for blood pressure control which means that we should probably add a potassium supplement of 20 mEq daily. We can then reassess in a 2 weeks.  Bryan Lemma, MD  Rx: K-Dur 20 mg p.o. daily, dispense 90 tabs, 3 refills. Marland Kitchen

## 2020-10-21 ENCOUNTER — Ambulatory Visit: Payer: 59

## 2020-10-24 ENCOUNTER — Other Ambulatory Visit (HOSPITAL_COMMUNITY)
Admission: RE | Admit: 2020-10-24 | Discharge: 2020-10-24 | Disposition: A | Discharge status: Home / Self Care | Payer: 59 | Source: Ambulatory Visit | Attending: Cardiology | Admitting: Cardiology

## 2020-10-24 DIAGNOSIS — Z79899 Other long term (current) drug therapy: Secondary | ICD-10-CM | POA: Diagnosis present

## 2020-10-24 DIAGNOSIS — I10 Essential (primary) hypertension: Secondary | ICD-10-CM | POA: Insufficient documentation

## 2020-10-24 DIAGNOSIS — I119 Hypertensive heart disease without heart failure: Secondary | ICD-10-CM | POA: Insufficient documentation

## 2020-10-24 LAB — BASIC METABOLIC PANEL
Anion gap: 6 (ref 5–15)
BUN: 12 mg/dL (ref 6–20)
CO2: 25 mmol/L (ref 22–32)
Calcium: 9 mg/dL (ref 8.9–10.3)
Chloride: 107 mmol/L (ref 98–111)
Creatinine, Ser: 0.79 mg/dL (ref 0.61–1.24)
GFR, Estimated: >60 mL/min (ref >60)
Glucose, Bld: 110 mg/dL — ABNORMAL HIGH (ref 70–99)
Potassium: 3.8 mmol/L (ref 3.5–5.1)
Sodium: 138 mmol/L (ref 135–145)

## 2020-11-01 ENCOUNTER — Other Ambulatory Visit: Payer: Self-pay

## 2020-11-01 ENCOUNTER — Ambulatory Visit (INDEPENDENT_AMBULATORY_CARE_PROVIDER_SITE_OTHER): Payer: 59 | Admitting: Pharmacist

## 2020-11-01 VITALS — BP 148/96 | HR 86 | Resp 16 | Ht 71.0 in | Wt 235.4 lb

## 2020-11-01 DIAGNOSIS — I119 Hypertensive heart disease without heart failure: Secondary | ICD-10-CM

## 2020-11-01 MED ORDER — OMRON 3 SERIES BP MONITOR DEVI: 1.0000 | Freq: Every day | 0 refills | Status: AC

## 2020-11-01 MED ORDER — OMRON 3 SERIES BP MONITOR DEVI: 1.0000 | Freq: Every day | 0 refills | Status: DC

## 2020-11-01 NOTE — Progress Notes (Signed)
Patient ID: William Berry                 DOB: Jan 05, 1976                      MRN: [id number]     HPI:  William Berry is a 45 y.o. male referred by Dr. Herbie Baltimore to HTN clinic. PMH includes accelerated hypertension, atypical chest pain, palpitations, ED, and hyperlipidemia.  Patient denies headaches, dizziness, chest pain, or chest tightness. 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 Reports missing medication yesterday and took later late dose today.   Current HTN meds:  Amlodipine 10mg  daily - morning Carvedilol 25mg  twice daily - 1 tab in AM (10am) and 1.5 in PM (9pm) Irbesartan/HCT 150-12.5mg  daily - morning  BP goal: 130/80  Family History: HTN in mother & father, granmother  Social History: former smoker, occasional alcohol use  Diet: mainly home cooked meals, accent or any other "complete" mix. Eats out 1-2 times per week.  Exercise: activities of daily living   Home BP readings: no machine  Wt Readings from Last 3 Encounters:  11/01/20 235 lb 6.4 oz (106.8 kg)  08/15/20 232 lb (105.2 kg)  03/20/19 226 lb 3.2 oz (102.6 kg)   BP Readings from Last 3 Encounters:  11/01/20 (!) 148/96  09/06/20 132/86  08/15/20 (!) 166/110   Pulse Readings from Last 3 Encounters:  11/01/20 86  09/06/20 88  08/15/20 85    Past Medical History:  Diagnosis Date  . 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  [facility] Erectile dysfunction    a. In setting of antihypertensive Rx.  07-27-1977 Heart murmur as a child    a. 05/2016 Echo: EF 55-60%, no rwma, mild LAE, trace MR/TR.  Marland Kitchen Hyperlipidemia   . Hypertension    a. 05/2016 Renal Duplex:  normal renal arteries.  . Tobacco abuse     Current Outpatient Medications on File Prior to Visit  Medication Sig Dispense Refill  . amLODipine (NORVASC) 10 MG tablet TAKE 1 TABLET (10 MG TOTAL) BY MOUTH DAILY. 90 tablet 0  .  carvedilol (COREG) 25 MG tablet Take 25 mg in the morning and 37.5 mg ( 1 and 1/2 table) in the evening. 180 tablet 0  . eszopiclone (LUNESTA) 2 MG TABS tablet Take 2 mg by mouth at bedtime as needed.    . irbesartan-hydrochlorothiazide (AVALIDE) 150-12.5 MG tablet Take 1 tablet by mouth daily. 30 tablet 5  . Multiple Vitamin (MULTIVITAMIN WITH MINERALS) TABS tablet Take 1 tablet by mouth daily.    . potassium chloride SA (KLOR-CON) 20 MEQ tablet Take 1 tablet (20 mEq total) by mouth daily. 90 tablet 3  . sildenafil (VIAGRA) 50 MG tablet Take 1 tablet (50 mg total) by mouth daily as needed for erectile dysfunction. 90 tablet 3   No current facility-administered medications on file prior to visit.    Allergies  Allergen Reactions  . Toradol [Ketorolac Tromethamine] Hives    Blood pressure (!) 148/96, pulse 86, resp. rate 16, height 5\' 11"  (1.803 m), weight 235 lb 6.4 oz (106.8 kg), SpO2 98 %.  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 Patient not monitoring BP at home and unwilling to pay for home BP cuff. Rx for omron BP cuff was given to see if  patient able to get free or change with his medical insurance. He is not willing to titrate medication today either, and will like time to work on compliance and lifestyle modifications.   Will continue all current medication as prescribed, and follow up in 6 weeks. This is the second visit patient request no medication change. During last OV he was taking medication as prescribed, and BP was almost at goal.   [name] PharmD, BCPS, CPP [facility] [address] [place] [address] 11/07/2020 12:13 PM

## 2020-11-01 NOTE — Patient Instructions (Addendum)
Return for a follow up appointment in 6 weeks  Check your blood pressure at home daily (if able) and keep record of the readings.  Take your BP meds as follows: *NO MEDICATION CHANGE*  *WORK ON COMPLIANCE with medication & LIFESTYLE MODIFICATION*  Bring all of your meds, your BP cuff and your record of home blood pressures to your next appointment.  Exercise as you're able, try to walk approximately 30 minutes per day.  Keep salt intake to a minimum, especially watch canned and prepared boxed foods.  Eat more fresh fruits and vegetables and fewer canned items.  Avoid eating in fast food restaurants.    HOW TO TAKE YOUR BLOOD PRESSURE: . Rest 5 minutes before taking your blood pressure. .  Don't smoke or drink caffeinated beverages for at least 30 minutes before. . Take your blood pressure before (not after) you eat. . Sit comfortably with your back supported and both feet on the floor (don't cross your legs). . Elevate your arm to heart level on a table or a desk. . Use the proper sized cuff. It should fit smoothly and snugly around your bare upper arm. There should be enough room to slip a fingertip under the cuff. The bottom edge of the cuff should be 1 inch above the crease of the elbow. . Ideally, take 3 measurements at one sitting and record the average.

## 2020-11-07 ENCOUNTER — Encounter: Payer: Self-pay | Admitting: Pharmacist

## 2020-11-07 NOTE — Assessment & Plan Note (Signed)
Blood pressure above goal. Patient not monitoring BP at home and unwilling to pay for home BP cuff. Rx for omron BP cuff was given to see if patient able to get free or change with his medical insurance. He is not willing to titrate medication today either, and will like time to work on compliance and lifestyle modifications.   Will continue all current medication as prescribed, and follow up in 6 weeks. This is the second visit patient request no medication change. During last OV he was taking medication as prescribed, and BP was almost at goal.

## 2020-11-09 ENCOUNTER — Other Ambulatory Visit: Payer: Self-pay | Admitting: Cardiology

## 2020-11-17 ENCOUNTER — Encounter (HOSPITAL_COMMUNITY): Payer: Self-pay | Admitting: Emergency Medicine

## 2020-11-17 ENCOUNTER — Emergency Department (HOSPITAL_COMMUNITY): Payer: 59

## 2020-11-17 ENCOUNTER — Other Ambulatory Visit: Payer: Self-pay

## 2020-11-17 ENCOUNTER — Emergency Department (HOSPITAL_COMMUNITY)
Admission: EM | Admit: 2020-11-17 | Discharge: 2020-11-17 | Disposition: A | Discharge status: Home / Self Care | Payer: 59 | Attending: Emergency Medicine | Admitting: Emergency Medicine

## 2020-11-17 DIAGNOSIS — S0990XA Unspecified injury of head, initial encounter: Secondary | ICD-10-CM

## 2020-11-17 DIAGNOSIS — I10 Essential (primary) hypertension: Secondary | ICD-10-CM | POA: Insufficient documentation

## 2020-11-17 DIAGNOSIS — S161XXA Strain of muscle, fascia and tendon at neck level, initial encounter: Secondary | ICD-10-CM | POA: Insufficient documentation

## 2020-11-17 DIAGNOSIS — Y92410 Unspecified street and highway as the place of occurrence of the external cause: Secondary | ICD-10-CM | POA: Diagnosis not present

## 2020-11-17 DIAGNOSIS — Z87891 Personal history of nicotine dependence: Secondary | ICD-10-CM | POA: Diagnosis not present

## 2020-11-17 DIAGNOSIS — S199XXA Unspecified injury of neck, initial encounter: Secondary | ICD-10-CM | POA: Diagnosis present

## 2020-11-17 MED ORDER — METHOCARBAMOL 500 MG PO TABS: 500.0000 mg | ORAL_TABLET | Freq: Three times a day (TID) | ORAL | 0 refills | Status: DC | PRN
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
## 2020-11-17 NOTE — Discharge Instructions (Signed)

## 2020-12-12 ENCOUNTER — Ambulatory Visit: Payer: Self-pay

## 2020-12-12 NOTE — Progress Notes (Deleted)
Patient ID: EFFREY DAVIDOW                 DOB: May 04, 1976                      MRN: [id number]     HPI:  William Berry is a 45 y.o. male referred by Dr. Herbie Baltimore to HTN clinic. PMH includes accelerated hypertension, atypical chest pain, palpitations, ED, and hyperlipidemia.  Patient denies headaches, dizziness, chest pain, or chest tightness. 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 Reports missing medication yesterday and took later late dose today.   Current HTN meds:  Amlodipine 10mg  daily - morning Carvedilol 25mg  twice daily - 1 tab in AM (10am) and 1.5 in PM (9pm) Irbesartan/HCT 150-12.5mg  daily - morning  BP goal: 130/80  Family History: HTN in mother & father, granmother  Social History: former smoker, occasional alcohol use  Diet: mainly home cooked meals, accent or any other "complete" mix. Eats out 1-2 times per week.  Exercise: activities of daily living   Home BP readings: no machine  Wt Readings from Last 3 Encounters:  11/17/20 230 lb (104.3 kg)  11/01/20 235 lb 6.4 oz (106.8 kg)  08/15/20 232 lb (105.2 kg)   BP Readings from Last 3 Encounters:  11/17/20 (!) 165/113  11/01/20 (!) 148/96  09/06/20 132/86   Pulse Readings from Last 3 Encounters:  11/17/20 95  11/01/20 86  09/06/20 88    Past Medical History:  Diagnosis Date   Chest pain    a.2005 Cath [facility]): nl cors, pLAd spasm, EF 65%; coronary calcium score of 0 (2018) -> 27 mm mid LAD myocardial bridging but no significant CAD.   Erectile dysfunction    a. In setting of antihypertensive Rx.   Heart murmur as a child    a. 05/2016 Echo: EF 55-60%, no rwma, mild LAE, trace MR/TR.   Hyperlipidemia    Hypertension    a. 05/2016 Renal Duplex:  normal renal arteries.   Tobacco abuse     Current Outpatient Medications on File Prior to Visit  Medication Sig Dispense Refill   carvedilol (COREG) 25 MG tablet TAKE 1 TABLET BY MOUTH TWICE A DAY 180 tablet 3   amLODipine (NORVASC) 10 MG  tablet TAKE 1 TABLET (10 MG TOTAL) BY MOUTH DAILY. 90 tablet 0   Blood Pressure Monitoring (OMRON 3 SERIES BP MONITOR) DEVI 1 each by Does not apply route daily. 1 each 0   eszopiclone (LUNESTA) 2 MG TABS tablet Take 2 mg by mouth at bedtime as needed.     irbesartan-hydrochlorothiazide (AVALIDE) 150-12.5 MG tablet Take 1 tablet by mouth daily. 30 tablet 5   methocarbamol (ROBAXIN) 500 MG tablet Take 1 tablet (500 mg total) by mouth every 8 (eight) hours as needed for muscle spasms. 20 tablet 0   Multiple Vitamin (MULTIVITAMIN WITH MINERALS) TABS tablet Take 1 tablet by mouth daily.     potassium chloride SA (KLOR-CON) 20 MEQ tablet Take 1 tablet (20 mEq total) by mouth daily. 90 tablet 3   sildenafil (VIAGRA) 50 MG tablet Take 1 tablet (50 mg total) by mouth daily as needed for erectile dysfunction. 90 tablet 3   No current facility-administered medications on file prior to visit.    Allergies  Allergen Reactions   Toradol [Ketorolac Tromethamine] Hives    There were no vitals taken for this visit.  No problem-specific Assessment & Plan notes found for this encounter.  Melinda Pottinger Rodriguez-Guzman  PharmD, BCPS, CPP [facility] [address] [place] [address] 12/12/2020 11:56 AM

## 2020-12-17 ENCOUNTER — Telehealth: Payer: Self-pay

## 2020-12-17 NOTE — Telephone Encounter (Signed)
Called and spoke w/pt who stated that they will call back when they are good and ready to r/s missed appt

## 2021-03-23 ENCOUNTER — Other Ambulatory Visit: Payer: Self-pay | Admitting: Cardiology
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
## 2022-01-19 ENCOUNTER — Telehealth: Payer: Self-pay | Admitting: Cardiology

## 2022-01-19 NOTE — Telephone Encounter (Signed)
Noted. Thanks for the update.   Recommend elevating legs when sitting as much as possible, wearing compression socks if he has them, following low sodium diet.   William Berry

## 2022-01-19 NOTE — Telephone Encounter (Signed)
Pt c/o swelling: STAT is pt has developed SOB within 24 hours  If swelling, where is the swelling located? Legs   How much weight have you gained and in what time span? Not sure   Have you gained 3 pounds in a day or 5 pounds in a week? Not sure   Do you have a log of your daily weights (if so, list)? no  Are you currently taking a fluid pill? yes  Are you currently SOB? no  Have you traveled recently? No   Pt states he has swelling in his legs and ankles, he denies any other symptoms. He also states he has been taking his medication as normal. He made an appt with Azalee Course, PA 02/24/22.

## 2022-01-19 NOTE — Telephone Encounter (Signed)
Patient reports that for the past 2 weeks, edema has been increasing on his shins to his ankles. Denies sob except when going up  stairs. He does not check BP. Appointment made with Brunetta Genera for 7/26.

## 2022-01-19 NOTE — Telephone Encounter (Signed)
Spoke with patient and gave the following recommendations per C. Dan Humphreys, NP.Marland KitchenMarland Kitchen"Recommend elevating legs when sitting as much as possible, wearing compression socks if he has them, following low sodium diet". Discussed low sodium diet with patient.

## 2022-01-21 ENCOUNTER — Ambulatory Visit (INDEPENDENT_AMBULATORY_CARE_PROVIDER_SITE_OTHER): Payer: Self-pay | Admitting: Family

## 2022-01-21 ENCOUNTER — Encounter (HOSPITAL_BASED_OUTPATIENT_CLINIC_OR_DEPARTMENT_OTHER): Payer: Self-pay | Admitting: Family

## 2022-01-21 VITALS — BP 134/100 | HR 90 | Ht 71.0 in | Wt 243.7 lb

## 2022-01-21 DIAGNOSIS — Z6833 Body mass index (BMI) 33.0-33.9, adult: Secondary | ICD-10-CM

## 2022-01-21 DIAGNOSIS — I10 Essential (primary) hypertension: Secondary | ICD-10-CM

## 2022-01-21 DIAGNOSIS — R600 Localized edema: Secondary | ICD-10-CM

## 2022-01-21 DIAGNOSIS — Q245 Malformation of coronary vessels: Secondary | ICD-10-CM

## 2022-01-21 MED ORDER — POTASSIUM CHLORIDE CRYS ER 20 MEQ PO TBCR: 20.0000 meq | EXTENDED_RELEASE_TABLET | Freq: Every day | ORAL | 5 refills | Status: DC

## 2022-01-21 MED ORDER — HYDROCHLOROTHIAZIDE 25 MG PO TABS: 25.0000 mg | ORAL_TABLET | Freq: Every day | ORAL | 5 refills | Status: DC

## 2022-01-21 MED ORDER — AMLODIPINE BESYLATE 10 MG PO TABS: 10.0000 mg | ORAL_TABLET | Freq: Every day | ORAL | 5 refills | Status: DC

## 2022-01-21 MED ORDER — CARVEDILOL 25 MG PO TABS: 25.0000 mg | ORAL_TABLET | Freq: Two times a day (BID) | ORAL | 5 refills | Status: DC

## 2022-01-21 MED ORDER — IRBESARTAN 150 MG PO TABS: 150.0000 mg | ORAL_TABLET | Freq: Every day | ORAL | 5 refills | Status: DC

## 2022-01-21 NOTE — Patient Instructions (Signed)
Medication Instructions:  Your physician has recommended you make the following change in your medication:   STOP: Irbesartan-HCTZ   START: Irbesartan 150mg  daily   START: Hydrochlorothiazide 25mg  daily   We have sent refills of Carvedilol, potassium, and amlodipine     *If you need a refill on your cardiac medications before your next appointment, please call your pharmacy*   Lab Work: Your physician recommends that you return for lab work in 2 weeks BMP  Please return for Lab work. You may come to the...   [facility] (3rd floor) [address], [place], [address] [place]  Open: 8am-Noon and 1pm-4:30pm  Please ring the doorbell on the small table when you exit the elevator and the Lab Tech will come get you  [facility] at [facility] [address], [place], [place] Open: 8am-1pm, then 2pm-4:30pm   [facility]- Please see attached locations sheet stapled to your lab work with address and hours.   If you have labs (blood work) drawn today and your tests are completely normal, you will receive your results only by: MyChart Message (if you have MyChart) OR A paper copy in the mail If you have any lab test that is abnormal or we need to change your treatment, we will call you to review the results.  Follow-Up: At [facility], you and your health needs are our priority.  As part of our continuing mission to provide you with exceptional heart care, we have created designated Provider Care Teams.  These Care Teams include your primary Cardiologist (physician) and Advanced Practice Providers (APPs -  Physician Assistants and Nurse Practitioners) who all work together to provide you with the care you need, when you need it.  We recommend signing up for the patient portal called "MyChart".  Sign up information is provided on this After Visit Summary.  MyChart is used to connect with patients for Virtual Visits (Telemedicine).   Patients are able to view lab/test results, encounter notes, upcoming appointments, etc.  Non-urgent messages can be sent to your provider as well.   To learn more about what you can do with MyChart, go to 01093.    Your next appointment:   3 month(s)  The format for your next appointment:   In Person  Provider:   [facility], MD  or [email or web address], NP {  Other Instructions To prevent or reduce lower extremity swelling: Eat a low salt diet. Salt makes the body hold onto extra fluid which causes swelling. Sit with legs elevated. For example, in the recliner or on an ottoman.  Wear knee-high compression stockings during the daytime. Ones labeled 15-20 mmHg provide good compression.  Exercise recommendations: The American Heart Association recommends 150 minutes of moderate intensity exercise weekly. Try 30 minutes of moderate intensity exercise 4-5 times per week. This could include walking, jogging, or swimming.  Heart Healthy Diet Recommendations: A low-salt diet is recommended. Meats should be grilled, baked, or boiled. Avoid fried foods. Focus on lean protein sources like fish or chicken with vegetables and fruits. The American Heart Association is a Bryan Lemma!  American Heart Association Diet and Lifeystyle Recommendations    Important Information About Sugar

## 2022-01-21 NOTE — Progress Notes (Signed)
Office Visit    Patient Name: William Berry Date of Encounter: 01/21/2022  PCP:  Ignatius Specking, MD   [facility]  Cardiologist:  Bryan Lemma, MD  Advanced Practice Provider:  No care team member to display Electrophysiologist:  None      Chief Complaint    William Berry is a 46 y.o. male presents today for lower extremity edema  Past Medical History    Past Medical History:  Diagnosis Date   Chest pain    a.2005 Cath [facility]): nl cors, pLAd spasm, EF 65%; coronary calcium score of 0 (2018) -> 27 mm mid LAD myocardial bridging but no significant CAD.   Erectile dysfunction    a. In setting of antihypertensive Rx.   Heart murmur as a child    a. 05/2016 Echo: EF 55-60%, no rwma, mild LAE, trace MR/TR.   Hyperlipidemia    Hypertension    a. 05/2016 Renal Duplex:  normal renal arteries.   Tobacco abuse    Past Surgical History:  Procedure Laterality Date   CARDIAC CATHETERIZATION  06/17/2007   Normal coronaries with proximal LAD spasm. Normal LV systolic function.    Allergies  Allergies  Allergen Reactions   Toradol [Ketorolac Tromethamine] Hives    History of Present Illness    William Berry is a 46 y.o. male with a hx of hypertension, myocardial bridge of LAD, prior tobacco use, palpitations, ED, hyperlipidemia last seen 11/01/2020 by pharmacy team.  Family history notable for hypertension in his mother, father, grandmother.  Prior renal Dopplers December 2017 unremarkable.  Echocardiogram December 2017 normal LVEF 60 to 65%, no RWMA, trace MR and TR, mild LAE.  Cardiac CTA 01/2017 did note myocardial bridge with coronary calcium score 0.  FFR with no significant stenosis.  Last seen by pharmacy team 10/2020.  He politely declined medication changes at that time preferring to continue with lifestyle changes.  Amlodipine 10 mg daily, carvedilol 25 mg a.m. and 37.5 mg p.m., irbesartan-HCTZ 150-12.5 mg daily continued.  He  contacted the office earlier this week noting 2-week history of edema from shins to ankles with no dyspnea.  Presents today for follow-up. Tells me he noted his legs started swelling while he was at the beach. He pressed on his ankles and noted he had some pitting edema. It was in his ankles and shins. Has improved since reducing sodium intake and elevating legs. Has some mild exertional dyspnea which he attributes to his weight. Dyspnea overall unchanged. No orthopnea, PND. Reports no chest pain, pressure, or tightness. No formal exercise routine but notes his weight is up today since last visit one year ago and plans to go back to [employer]. Eats both at home and out, often high salt diet.  EKGs/Labs/Other Studies Reviewed:   The following studies were reviewed today:  Echo 05/2016 -------------------------------------------------------------------  Study Conclusions   - Left ventricle: The cavity size was normal. Wall thickness was    normal. Systolic function was normal. The estimated ejection    fraction was in the range of 60% to 65%. Wall motion was normal;    there were no regional wall motion abnormalities.  - Left atrium: The atrium was mildly dilated.   Impressions:   - Normal LV systolic function; mild LAE; trace MR and TR.   Coronary CTA 01/2017 Aorta:  Normal size.  No calcifications.  No dissection.   Aortic Valve:  Trileaflet.  No calcifications.   Coronary Arteries:  Normal coronary origin.  Right dominance.   RCA is a large dominant artery that gives rise to PDA. There is no plaque.   Left main is a large artery that gives rise to LAD and LCX arteries. There is a long intramyocardial bridge in the mid LAD measuring 27 mm with dynamic narrowing during systole.   LAD is a large vessel that has no plaque.   LCX is a non-dominant artery that gives rise to one large OM1 branch. There is no plaque.   Other findings:   Normal pulmonary vein drainage into the  left atrium.   Normal let atrial appendage without a thrombus.   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   IMPRESSION: 1. Coronary calcium score of 0. This was 0 percentile for age and sex matched control.   2. Normal coronary origin with right dominance.   3. No evidence of CAD. 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   4. 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  FFR 1. Left Main:  No significant stenosis.   2. LAD: No significant stenosis. Proximal LAD FFR: 0.92, mid LAD FFR: 0.87, distal LAD FFR: 0.84. 3. LCX: No significant stenosis. 4. RCA: No significant stenosis.   IMPRESSION: 1.  CT FFR analysis didn't show any significant stenosis.  EKG:  EKG is  ordered today.  The ekg ordered today demonstrates NSR 90 bpm with no acute ST/T wave changes.   Recent Labs: No results found for requested labs within last 365 days.  Recent Lipid Panel    Component Value Date/Time   CHOL  07/10/2007 0715    144        ATP III CLASSIFICATION:  <200     mg/dL   Desirable  751-025  mg/dL   Borderline High  >=852    mg/dL   High          TRIG 57 07/10/2007 0715   HDL 44 07/10/2007 0715   CHOLHDL 3.3 07/10/2007 0715   VLDL 11 07/10/2007 0715   LDLCALC  07/10/2007 0715    89        Total Cholesterol/HDL:CHD Risk Coronary Heart Disease Risk Table                     Men   Women  1/2 Average Risk   3.4   3.3  Average Risk       5.0   4.4  2 X Average Risk   9.6   7.1  3 X Average Risk  23.4   11.0        Use the calculated Patient Ratio above and the CHD Risk Table to determine the patient's CHD Risk.        ATP III CLASSIFICATION (LDL):  <100     mg/dL   Optimal  778-242  mg/dL   Near or Above                    Optimal  130-159  mg/dL   Borderline  353-614  mg/dL   High  >431     mg/dL   Very High     Home Medications   Current Meds  Medication Sig   Blood Pressure  Monitoring (OMRON 3 SERIES BP MONITOR) DEVI 1 each by Does not apply route daily.   hydrochlorothiazide (HYDRODIURIL) 25 MG tablet Take 1 tablet (25 mg total) by mouth  daily.   irbesartan (AVAPRO) 150 MG tablet Take 1 tablet (150 mg total) by mouth daily.   Multiple Vitamin (MULTIVITAMIN WITH MINERALS) TABS tablet Take 1 tablet by mouth daily.   sildenafil (VIAGRA) 50 MG tablet Take 1 tablet (50 mg total) by mouth daily as needed for erectile dysfunction.   zolpidem (AMBIEN) 10 MG tablet Take 10 mg by mouth at bedtime.   [DISCONTINUED] amLODipine (NORVASC) 10 MG tablet TAKE 1 TABLET (10 MG TOTAL) BY MOUTH DAILY.   [DISCONTINUED] carvedilol (COREG) 25 MG tablet TAKE 1 TABLET BY MOUTH TWICE A DAY   [DISCONTINUED] irbesartan-hydrochlorothiazide (AVALIDE) 150-12.5 MG tablet TAKE 1 TABLET BY MOUTH EVERY DAY   [DISCONTINUED] potassium chloride SA (KLOR-CON) 20 MEQ tablet Take 1 tablet (20 mEq total) by mouth daily.     Review of Systems      All other systems reviewed and are otherwise negative except as noted above.  Physical Exam    VS:  BP (!) 134/100 (BP Location: Right Arm, Patient Position: Sitting, Cuff Size: Large)   Pulse 90   Ht 5\' 11"  (1.803 m)   Wt 243 lb 11.2 oz (110.5 kg)   BMI 33.99 kg/m  , BMI Body mass index is 33.99 kg/m.  Wt Readings from Last 3 Encounters:  01/21/22 243 lb 11.2 oz (110.5 kg)  11/17/20 230 lb (104.3 kg)  11/01/20 235 lb 6.4 oz (106.8 kg)     GEN: Well nourished, overweight, well developed, in no acute distress. HEENT: normal. Neck: Supple, no JVD, carotid bruits, or masses. Cardiac: RRR, no murmurs, rubs, or gallops. No clubbing, cyanosis, edema.  Radials/PT 2+ and equal bilaterally.  Respiratory:  Respirations regular and unlabored, clear to auscultation bilaterally. GI: Soft, nontender, nondistended. MS: No deformity or atrophy. Skin: Warm and dry, no rash. Neuro:  Strength and sensation are intact. Psych: Normal affect.  Assessment & Plan     Lower extremity edema - Noted LE edema while at the beach. No dyspnea, orthopnea, PND concerning for heart failure.  No indication for echocardiogram.  Has improved with elevation and lowering sodium intake. Likely etiology venous insufficiency vs high sodium intake. No edema appreciated on exam.  We will increase hydrochlorothiazide to 25 mg daily due to elevated blood pressure.  Myocardial bridge of LAD -noted on both cardiac catheterization and coronary CTA, no lesion noted. Stable with no anginal symptoms. No indication for ischemic evaluation.    Sinus tachycardia - EKG today NSR 90 bpm. No palpitations. Continue Coreg 25mg  BID. Refill provided.   Obesity - Weight loss via diet and exercise encouraged. Discussed the impact being overweight would have on cardiovascular risk. Politely declines referral to PREP.  Hypertension - BP not at goal <130/80. Continue AMlodipine 10mg  QD, Coreg 25mg  BID. Stop Irbesartan-HCTZ 150-12.5mg . Start Irbesartan 150mg  QD and HCTZ 25mg  QD due to some LE edema, elevated BP. BMP in 2 weeks. If BP not controlled at follow up, could consider increasing Irbesartan to 300mg  daily.  HYPERTENSION CONTROL Vitals:   01/21/22 1339 01/21/22 1348  BP: (!) 144/100 (!) 134/100    The patient's blood pressure is elevated above target today.  The following intervention was performed to address the patient's elevated BP:  - A current anti-hypertensive medication was adjusted today.     Disposition: Follow up in 3 month(s) with , MD or APP.  Signed, , NP 01/21/2022, 2:20 PM [facility]

## 2022-02-24 ENCOUNTER — Ambulatory Visit: Payer: Self-pay | Admitting: Physician Assistant

## 2022-04-28 NOTE — Progress Notes (Deleted)
Office Visit    Patient Name: William Berry Date of Encounter: 04/28/2022  PCP:  Glenda Chroman, MD   [place]  Cardiologist:  Glenetta Hew, MD  Advanced Practice Provider:  No care team member to display Electrophysiologist:  None      Chief Complaint    William Berry is a 46 y.o. male presents today for follow up after medication changes.   Past Medical History    Past Medical History:  Diagnosis Date   Chest pain    a.2005 Cath [facility]): nl cors, pLAd spasm, EF 65%; coronary calcium score of 0 (2018) -> 27 mm mid LAD myocardial bridging but no significant CAD.   Erectile dysfunction    a. In setting of antihypertensive Rx.   Heart murmur as a child    a. 05/2016 Echo: EF 55-60%, no rwma, mild LAE, trace MR/TR.   Hyperlipidemia    Hypertension    a. 05/2016 Renal Duplex:  normal renal arteries.   Tobacco abuse    Past Surgical History:  Procedure Laterality Date   CARDIAC CATHETERIZATION  06/17/2007   Normal coronaries with proximal LAD spasm. Normal LV systolic function.    Allergies  Allergies  Allergen Reactions   Toradol [Ketorolac Tromethamine] Hives    History of Present Illness    William Berry is a 46 y.o. male with a hx of hypertension, myocardial bridge of LAD, prior tobacco use, palpitations, ED, hyperlipidemia last seen 01/21/22.  Family history notable for hypertension in his mother, father, grandmother.  Prior renal Dopplers December 2017 unremarkable.  Echocardiogram December 2017 normal LVEF 60 to 65%, no RWMA, trace MR and TR, mild LAE.  Cardiac CTA 01/2017 did note myocardial bridge with coronary calcium score 0.  FFR with no significant stenosis.  Seen by pharmacy team 10/2020.  He politely declined medication changes at that time preferring to continue with lifestyle changes.  Amlodipine 10 mg daily, carvedilol 25 mg a.m. and 37.5 mg p.m., irbesartan-HCTZ 150-12.5 mg daily continued.  Seen 01/21/22  noting LE edema and elevated blood pressure. Somewhat improved since reducing salt and elevating legs. HCTZ was increased to 25mg  daily and Irbesartan 150mg  daily continued. ***  EKGs/Labs/Other Studies Reviewed:   The following studies were reviewed today:  Echo 05/2016 -------------------------------------------------------------------  Study Conclusions   - Left ventricle: The cavity size was normal. Wall thickness was    normal. Systolic function was normal. The estimated ejection    fraction was in the range of 60% to 65%. Wall motion was normal;    there were no regional wall motion abnormalities.  - Left atrium: The atrium was mildly dilated.   Impressions:   - Normal LV systolic function; mild LAE; trace MR and TR.   Coronary CTA 01/2017 Aorta:  Normal size.  No calcifications.  No dissection.   Aortic Valve:  Trileaflet.  No calcifications.   Coronary Arteries:  Normal coronary origin.  Right dominance.   RCA is a large dominant artery that gives rise to PDA. There is no plaque.   Left main is a large artery that gives rise to LAD and LCX arteries. There is a long intramyocardial bridge in the mid LAD measuring 27 mm with dynamic narrowing during systole.   LAD is a large vessel that has no plaque.   LCX is a non-dominant artery that gives rise to one large OM1 branch. There is no plaque.   Other findings:   Normal pulmonary vein  drainage into the left atrium.   Normal let atrial appendage without a thrombus.   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   IMPRESSION: 1. Coronary calcium score of 0. This was 0 percentile for age and sex matched control.   2. Normal coronary origin with right dominance.   3. No evidence of CAD. 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   4. 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  FFR 1. Left Main:  No significant stenosis.    2. LAD: No significant stenosis. Proximal LAD FFR: 0.92, mid LAD FFR: 0.87, distal LAD FFR: 0.84. 3. LCX: No significant stenosis. 4. RCA: No significant stenosis.   IMPRESSION: 1.  CT FFR analysis didn't show any significant stenosis.  EKG:  EKG is  ordered today.  The ekg ordered today demonstrates NSR 90 bpm with no acute ST/T wave changes.   Recent Labs: No results found for requested labs within last 365 days.  Recent Lipid Panel    Component Value Date/Time   CHOL  07/10/2007 0715    144        ATP III CLASSIFICATION:  <200     mg/dL   Desirable  161-096  mg/dL   Borderline High  >=045    mg/dL   High          TRIG 57 07/10/2007 0715   HDL 44 07/10/2007 0715   CHOLHDL 3.3 07/10/2007 0715   VLDL 11 07/10/2007 0715   LDLCALC  07/10/2007 0715    89        Total Cholesterol/HDL:CHD Risk Coronary Heart Disease Risk Table                     Men   Women  1/2 Average Risk   3.4   3.3  Average Risk       5.0   4.4  2 X Average Risk   9.6   7.1  3 X Average Risk  23.4   11.0        Use the calculated Patient Ratio above and the CHD Risk Table to determine the patient's CHD Risk.        ATP III CLASSIFICATION (LDL):  <100     mg/dL   Optimal  409-811  mg/dL   Near or Above                    Optimal  130-159  mg/dL   Borderline  914-782  mg/dL   High  >956     mg/dL   Very High     Home Medications   No outpatient medications have been marked as taking for the 04/29/22 encounter (Appointment) with Alver Sorrow, NP.     Review of Systems      All other systems reviewed and are otherwise negative except as noted above.  Physical Exam    VS:  There were no vitals taken for this visit. , BMI There is no height or weight on file to calculate BMI.  Wt Readings from Last 3 Encounters:  01/21/22 243 lb 11.2 oz (110.5 kg)  11/17/20 230 lb (104.3 kg)  11/01/20 235 lb 6.4 oz (106.8 kg)     GEN: Well nourished, overweight, well developed, in no acute  distress. HEENT: normal. Neck: Supple, no JVD, carotid bruits, or masses. Cardiac: RRR, no murmurs, rubs, or gallops. No clubbing, cyanosis, edema.  Radials/PT 2+ and equal bilaterally.  Respiratory:  Respirations regular and unlabored, clear to auscultation bilaterally. GI: Soft, nontender, nondistended. MS: No deformity or atrophy. Skin: Warm and dry, no rash. Neuro:  Strength and sensation are intact. Psych: Normal affect.  Assessment & Plan    Lower extremity edema - ***  Myocardial bridge of LAD -noted on both cardiac catheterization and coronary CTA, no lesion noted. Stable with no anginal symptoms. No indication for ischemic evaluation.  ***  Sinus tachycardia - No palpitations. Continue Coreg 25mg  BID.  ***  Obesity - Weight loss via diet and exercise encouraged. Discussed the impact being overweight would have on cardiovascular risk. Previously politely declined referral to PREP.***  Hypertension - ***  No BP recorded.  {Refresh Note OR Click here to enter BP  :1}***  Disposition: Follow up in 3 month(s) with , MD or APP.  Signed, Bryan Lemma, NP 04/28/2022, 4:41 PM [facility]

## 2022-04-29 ENCOUNTER — Ambulatory Visit (HOSPITAL_BASED_OUTPATIENT_CLINIC_OR_DEPARTMENT_OTHER): Payer: Self-pay | Admitting: Family

## 2022-06-18 ENCOUNTER — Telehealth: Payer: Self-pay

## 2022-06-19 NOTE — Telephone Encounter (Signed)
Open in error

## 2022-07-30 ENCOUNTER — Telehealth: Payer: Self-pay | Admitting: Cardiology

## 2022-07-30 ENCOUNTER — Other Ambulatory Visit (HOSPITAL_BASED_OUTPATIENT_CLINIC_OR_DEPARTMENT_OTHER): Payer: Self-pay | Admitting: Family

## 2022-07-30 DIAGNOSIS — R600 Localized edema: Secondary | ICD-10-CM

## 2022-07-30 DIAGNOSIS — I10 Essential (primary) hypertension: Secondary | ICD-10-CM

## 2022-07-30 NOTE — Telephone Encounter (Signed)
Rx(s) sent to pharmacy electronically.  

## 2022-07-30 NOTE — Telephone Encounter (Signed)
*  STAT* If patient is at the pharmacy, call can be transferred to refill team.   1. Which medications need to be refilled? (please list name of each medication and dose if known)  irbesartan (AVAPRO) 150 MG tablet   carvedilol (COREG) 25 MG tablet  amLODipine (NORVASC) 10 MG tablet  hydrochlorothiazide (HYDRODIURIL) 25 MG tablet  2. Which pharmacy/location (including street and city if local pharmacy) is medication to be sent to? [id number] - [place], [place] - [place]   3. Do they need a 30 day or 90 day supply? 90 day  Patient states he is out of medication.

## 2022-09-12 ENCOUNTER — Emergency Department (HOSPITAL_COMMUNITY)
Admission: EM | Admit: 2022-09-12 | Discharge: 2022-09-12 | Disposition: A | Discharge status: Home / Self Care | Payer: Self-pay | Attending: Emergency Medicine | Admitting: Emergency Medicine

## 2022-09-12 ENCOUNTER — Encounter (HOSPITAL_COMMUNITY): Payer: Self-pay

## 2022-09-12 ENCOUNTER — Other Ambulatory Visit: Payer: Self-pay

## 2022-09-12 DIAGNOSIS — Z79899 Other long term (current) drug therapy: Secondary | ICD-10-CM | POA: Insufficient documentation

## 2022-09-12 DIAGNOSIS — I10 Essential (primary) hypertension: Secondary | ICD-10-CM | POA: Insufficient documentation

## 2022-09-12 DIAGNOSIS — M5416 Radiculopathy, lumbar region: Secondary | ICD-10-CM | POA: Insufficient documentation

## 2022-09-12 MED ORDER — METHYLPREDNISOLONE 4 MG PO TBPK: ORAL_TABLET | ORAL | 0 refills | Status: DC

## 2022-09-12 MED ORDER — DEXAMETHASONE SODIUM PHOSPHATE 10 MG/ML IJ SOLN
10.0000 mg | Freq: Once | INTRAMUSCULAR | Status: AC
  Administered 2022-09-12: 10 mg via INTRAMUSCULAR
  Filled 2022-09-12: qty 1

## 2022-09-12 MED ORDER — OXYCODONE HCL 5 MG PO TABS
10.0000 mg | ORAL_TABLET | ORAL | Status: AC
  Administered 2022-09-12: 10 mg via ORAL
  Filled 2022-09-12: qty 2

## 2022-09-12 NOTE — ED Triage Notes (Signed)
Pt reports left side lower back pain radiating down his left leg x 3 days.

## 2022-09-12 NOTE — ED Provider Notes (Signed)
[facility] Provider Note   CSN: [id number] Arrival date & time: 09/12/22  1923     History  Chief Complaint  Patient presents with   Back Pain    William Berry is a 47 y.o. male.   Back Pain  This patient is a 47 year old male history of hypertension, no history of back problems.  He reports about 3 days of worsening pain in the left buttock going down the left leg anteriorly with associated decrease sensation of his lateral 3 toes.  He has not noticed any weakness of the leg and he has no red flags for back pain including no injuries, he is able to find a comfortable position when laying down.  He reports no history of urinary incontinence, no IV drug use, no other significant medical problems, denies history of cancer.  Over-the-counter medications without relief    Home Medications Prior to Admission medications   Medication Sig Start Date End Date Taking? Authorizing Provider  methylPREDNISolone (MEDROL DOSEPAK) 4 MG TBPK tablet Taper over 6 days 09/12/22  Yes Noemi Chapel, MD  amLODipine (NORVASC) 10 MG tablet Take 1 tablet by mouth once daily 07/30/22   Leonie Man, MD  Blood Pressure Monitoring (OMRON 3 SERIES BP MONITOR) DEVI 1 each by Does not apply route daily. 11/01/20   Rodriguez-Guzman, Raquel, RPH-CPP  carvedilol (COREG) 25 MG tablet Take 1 tablet (25 mg total) by mouth 2 (two) times daily with a meal. 01/21/22   Loel Dubonnet, NP  hydrochlorothiazide (HYDRODIURIL) 25 MG tablet Take 1 tablet by mouth once daily 07/30/22   Leonie Man, MD  irbesartan Levy Sjogren) 150 MG tablet Take 1 tablet by mouth once daily 07/30/22   Leonie Man, MD  Multiple Vitamin (MULTIVITAMIN WITH MINERALS) TABS tablet Take 1 tablet by mouth daily.    [provider]  potassium chloride SA (KLOR-CON M) 20 MEQ tablet Take 1 tablet (20 mEq total) by mouth daily. 01/21/22 07/20/22  Loel Dubonnet, NP  sildenafil (VIAGRA) 50 MG  tablet Take 1 tablet (50 mg total) by mouth daily as needed for erectile dysfunction. 10/14/16   Theora Gianotti, NP  zolpidem (AMBIEN) 10 MG tablet Take 10 mg by mouth at bedtime. 01/16/22   [provider]      Allergies    Toradol [ketorolac tromethamine]    Review of Systems   Review of Systems  Musculoskeletal:  Positive for back pain.  All other systems reviewed and are negative.   Physical Exam Updated Vital Signs BP (!) 146/98 (BP Location: Right Arm)   Pulse 98   Temp 98 F (36.7 C) (Oral)   Resp 18   Ht 1.803 m (5\' 11" )   Wt 106.6 kg   SpO2 98%   BMI 32.78 kg/m  Physical Exam Vitals and nursing note reviewed.  Constitutional:      General: He is not in acute distress.    Appearance: He is well-developed.  HENT:     Head: Normocephalic and atraumatic.     Mouth/Throat:     Pharynx: No oropharyngeal exudate.  Eyes:     General: No scleral icterus.       Right eye: No discharge.        Left eye: No discharge.     Conjunctiva/sclera: Conjunctivae normal.     Pupils: Pupils are equal, round, and reactive to light.  Neck:     Thyroid: No thyromegaly.  Vascular: No JVD.  Cardiovascular:     Rate and Rhythm: Normal rate and regular rhythm.     Heart sounds: Normal heart sounds. No murmur heard.    No friction rub. No gallop.  Pulmonary:     Effort: Pulmonary effort is normal. No respiratory distress.     Breath sounds: Normal breath sounds. No wheezing or rales.  Abdominal:     General: Bowel sounds are normal. There is no distension.     Palpations: Abdomen is soft. There is no mass.     Tenderness: There is no abdominal tenderness.  Musculoskeletal:        General: Tenderness present. Normal range of motion.     Cervical back: Normal range of motion and neck supple.     Right lower leg: No edema.     Left lower leg: No edema.     Comments: Tender to palpation in the left lower back very low down into the buttock  Lymphadenopathy:      Cervical: No cervical adenopathy.  Skin:    General: Skin is warm and dry.     Findings: No erythema or rash.  Neurological:     Mental Status: He is alert.     Coordination: Coordination normal.     Comments: Able to straight leg raise bilaterally, it does cause pain to straight leg raise on the left both actively and passively.  He has totally normal strength at the hip knee and ankle in both plantarflexion and dorsiflexion.  He is able to dorsiflex the great toe, normal sensation of the great toe in the webspace of the first and second toes.  He has decreased sensation to the lateral 3 toes.  Normal sensation of the bottom of the foot.  Reflexes are slightly decreased at the left patellar tendon and normal at the right patellar tendon.  Psychiatric:        Behavior: Behavior normal.     ED Results / Procedures / Treatments   Labs (all labs ordered are listed, but only abnormal results are displayed) Labs Reviewed - No data to display  EKG None  Radiology No results found.  Procedures Procedures    Medications Ordered in ED Medications  oxyCODONE (Oxy IR/ROXICODONE) immediate release tablet 10 mg (has no administration in time range)  dexamethasone (DECADRON) injection 10 mg (has no administration in time range)    ED Course/ Medical Decision Making/ A&P                             Medical Decision Making Risk Prescription drug management.   Patient likely has a radiculopathy of L5.  This could be sciatica, either way the patient will be given a dose of pain medicine in the ER, oxycodone ordered, Decadron intramuscular ordered, home with an anti-inflammatory and follow-up.  The patient was given the indications for return including pathological red flags and worsening symptoms and expresses understanding.        Final Clinical Impression(s) / ED Diagnoses Final diagnoses:  Lumbar radiculopathy    Rx / DC Orders ED Discharge Orders          Ordered     methylPREDNISolone (MEDROL DOSEPAK) 4 MG TBPK tablet        09/12/22 1950              Noemi Chapel, MD 09/12/22 1951

## 2022-09-12 NOTE — Discharge Instructions (Signed)
Back Pain:  Medrol dose pack for 6 days. Tylenol or motrin for pain Should gradually get better Stay out of work for 2-3 days Have your doctor recheck you in 3 days  ER for worsening symptoms.  Your back pain should be treated with medicines such as ibuprofen or aleve and this back pain should get better over the next 2 weeks.  However if you develop severe or worsening pain, low back pain with fever, numbness, weakness or inability to walk or urinate, you should return to the ER immediately.  Please follow up with your doctor this week for a recheck if still having symptoms. Low back pain is discomfort in the lower back that may be due to injuries to muscles and ligaments around the spine.  Occasionally, it may be caused by a a problem to a part of the spine called a disc.  The pain may last several days or a week;  However, most patients get completely well in 4 weeks.  Self - care:  The application of heat can help soothe the pain.  Maintaining your daily activities, including walking, is encourged, as it will help you get better faster than just staying in bed.  Medications are also useful to help with pain control.  A commonly prescribed medications includes acetaminophen.  This medication is generally safe, though you should not take more than 8 of the extra strength (500mg ) pills a day.  Non steroidal anti inflammatory medications including Ibuprofen and naproxen;  These medications help both pain and swelling and are very useful in treating back pain.  They should be taken with food, as they can cause stomach upset, and more seriously, stomach bleeding.    Muscle relaxants:  These medications can help with muscle tightness that is a cause of lower back pain.  Most of these medications can cause drowsiness, and it is not safe to drive or use dangerous machinery while taking them.  You will need to follow up with  Your primary healthcare provider in 1-2 weeks for reassessment.  Be aware  that if you develop new symptoms, such as a fever, leg weakness, difficulty with or loss of control of your urine or bowels, abdominal pain, or more severe pain, you will need to seek medical attention and  / or return to the Emergency department.  If you do not have a doctor see the list below.  RESOURCE GUIDE  Chronic Pain Problems: Contact [facility]  [phone] Patients need to be referred by their primary care doctor.  Insufficient Money for Medicine: Contact [employer]:  call "211" or [place] [phone].  No Primary Care Doctor: Call [facility]  [phone] - can help you locate a primary care doctor that  accepts your insurance, provides certain services, etc. Physician Referral [phone]  Agencies that provide inexpensive medical care: [facility]  [facility]  [phone] [facility]  [phone] [facility]  [phone] [facility]  [phone] [facility]  [phone]  [place] Providers: [facility]- [address]  [phone], Mon-Fri 9am-7pm, Sat 9am-1pm [place], [address] [place]  [place], [address] [place]  [place]- [address]  [place], [address], [phone]  Only accepts [place] patients after they have their name  applied to their card  Self Pay (no insurance) in [facility]: Sickle Cell Patients: Dr Kevan Ny, [facility]  [place], [facility]- [place]  [facility] [place]- [phone] [place], [facility]- see information above (Speak to [employer] if you do not have insurance)       -  [facility]-  [place], [place],  [place], [place]  Dr Vista Lawman-  [address], [place], [facility]- [address] [phone]       -  [facility] [place]- [address], [place], also [address], [phone]       -    [facility]- [address], [place], 1st & 3rd Saturday   every month, 10am-1pm  1) Find a Doctor and Pay Out of Pocket Although you won't have to find out who is covered by your insurance plan, it is a good idea to ask around and get recommendations. You will then need to call the office and see if the doctor you have chosen will accept you as a new patient and what types of options they offer for patients who are self-pay. Some doctors offer discounts or will set up payment plans for their patients who do not have insurance, but you will need to ask so you aren't surprised when you get to your appointment.  2) Contact Your Local Health Department Not all health departments have doctors that can see patients for sick visits, but many do, so it is worth a call to see if yours does. If you don't know where your local health department is, you can check in your phone book. The CDC also has a tool to help you locate your state's health department, and many state websites also have listings of all of their local health departments.  3) Find a [facility] If your illness is not likely to be very severe or complicated, you may want to try a walk in clinic. These are popping up all over the country in pharmacies, drugstores, and shopping centers. They're usually staffed by nurse practitioners or physician assistants that have been trained to treat common illnesses and complaints. They're usually fairly quick and inexpensive. However, if you have serious medical issues or chronic medical  problems, these are probably not your best option  [facility] [place], [facility], [address], [place], phone [phone] or [phone].  Monday - Friday, call for an appointment. [place], [facility], [address], [place], phone [phone] or [phone].  Monday - Friday, call for an appointment.  Abuse/Neglect: [place] [phone] [place] [phone] (After Hours)  Emergency Shelter:  [facility] [phone]  Maternity Homes: Room at the [place] [phone] [place] [phone]  MRSA Hotline #:   [phone]  [facility]  [facility] Dept. [address]         [place] Phone:  [phone]                                  Phone:  [phone]                   Phone:  [phone]  [facility], [place]- [phone]       -     [facility] in [place], [address],                                  [place] [phone] or [phone] (After Hours)   [place]  Substance Abuse Resources: [facility] Drug [facility]  [phone] [place] [phone] [facility] [place] [place] [phone] Residential & Outpatient Substance Abuse Program  [phone]  Psychological Services: [place]  [phone] [facility]  [place], [place]. [address] [place], [place]:  [phone] or [phone], [employer]  Dental Assistance  If unable to pay or uninsured, contact:  [facility] or [facility]. to become qualified for the adult dental clinic.  Patients with Medicaid: [facility] [phone] [address], [place] [address], [phone]  If unable to pay, or uninsured, contact [facility] [phone]) or [place] [phone] in [place], [place] in [facility]) to become qualified for the adult dental clinic  Other [place]- [place], [place], [place], [address], [place], [place], 2nd and 4th Thursday of the month at 6:30am.  10 clients each day by appointment, can sometimes see walk-in patients if someone does not show for an appointment. [facility]- [address] Hillard Danker [place], [place], [address], [place], [place], [place], [address], [place] Department- [phone] [facility] [phone]

## 2022-10-29 ENCOUNTER — Telehealth: Payer: Self-pay | Admitting: Cardiology

## 2022-10-29 ENCOUNTER — Other Ambulatory Visit (HOSPITAL_BASED_OUTPATIENT_CLINIC_OR_DEPARTMENT_OTHER): Payer: Self-pay

## 2022-10-29 DIAGNOSIS — R600 Localized edema: Secondary | ICD-10-CM

## 2022-10-29 DIAGNOSIS — I10 Essential (primary) hypertension: Secondary | ICD-10-CM

## 2022-10-29 MED ORDER — IRBESARTAN 150 MG PO TABS: 150.0000 mg | ORAL_TABLET | Freq: Every day | ORAL | 0 refills | Status: DC

## 2022-10-29 MED ORDER — POTASSIUM CHLORIDE CRYS ER 20 MEQ PO TBCR: 20.0000 meq | EXTENDED_RELEASE_TABLET | Freq: Every day | ORAL | 0 refills | Status: DC

## 2022-10-29 MED ORDER — HYDROCHLOROTHIAZIDE 25 MG PO TABS: 25.0000 mg | ORAL_TABLET | Freq: Every day | ORAL | 0 refills | Status: DC

## 2022-10-29 MED ORDER — AMLODIPINE BESYLATE 10 MG PO TABS: 10.0000 mg | ORAL_TABLET | Freq: Every day | ORAL | 0 refills | Status: DC

## 2022-10-29 NOTE — Telephone Encounter (Signed)
*  STAT* If patient is at the pharmacy, call can be transferred to refill team.   1. Which medications need to be refilled? (please list name of each medication and dose if known)  irbesartan (AVAPRO) 150 MG tablet carvedilol (COREG) 25 MG tablet amLODipine (NORVASC) 10 MG tablet hydrochlorothiazide (HYDRODIURIL) 25 MG tablet  2. Which pharmacy/location (including street and city if local pharmacy) is medication to be sent to? [facility] [address], [place] - [address]  3. Do they need a 30 day or 90 day supply?  90 day supply

## 2022-10-29 NOTE — Telephone Encounter (Signed)
From: [name] To: Office of Alver Sorrow, NP Sent: 10/27/2022 11:27 PM EDT Subject: Medication Renewal Request  Refills have been requested for the following medications:   potassium chloride SA (KLOR-CON M) 20 MEQ tablet [Caitlin S Walker]  Patient Comment: less than a 7 day supply on this medication   hydrochlorothiazide (HYDRODIURIL) 25 MG tablet [David Harding]  Patient Comment: I have less than a 7 day supply on this medication    amLODipine (NORVASC) 10 MG tablet [David Harding]  Patient Comment: le less than a 7 day supply on this medicationss than a 7 day supply on this medication   irbesartan (AVAPRO) 150 MG tablet [David Harding]  Patient Comment: less than a 7 day supply on this medication  Preferred pharmacy: [facility] - [place], [place] - [address] Delivery method: [employer]

## 2022-10-30 MED ORDER — CARVEDILOL 25 MG PO TABS: 25.0000 mg | ORAL_TABLET | Freq: Two times a day (BID) | ORAL | 1 refills | Status: DC

## 2022-10-30 MED ORDER — AMLODIPINE BESYLATE 10 MG PO TABS: 10.0000 mg | ORAL_TABLET | Freq: Every day | ORAL | 1 refills | Status: DC

## 2022-10-30 MED ORDER — HYDROCHLOROTHIAZIDE 25 MG PO TABS: 25.0000 mg | ORAL_TABLET | Freq: Every day | ORAL | 1 refills | Status: DC

## 2022-10-30 MED ORDER — IRBESARTAN 150 MG PO TABS: 150.0000 mg | ORAL_TABLET | Freq: Every day | ORAL | 1 refills | Status: DC

## 2022-10-30 NOTE — Telephone Encounter (Signed)
I left the patient a message that his requested refills have been sent to his pharmacy and I left his appointment information reminding him of his scheduled appointment.

## 2022-12-11 ENCOUNTER — Ambulatory Visit: Payer: Self-pay | Attending: Nurse Practitioner | Admitting: Nurse Practitioner

## 2022-12-11 ENCOUNTER — Encounter: Payer: Self-pay | Admitting: Nurse Practitioner

## 2022-12-11 NOTE — Progress Notes (Deleted)
Office Visit    Patient Name: William Berry Date of Encounter: 12/11/2022  Primary Care Provider:  Ignatius Specking, MD Primary Cardiologist:  Bryan Lemma, MD  Chief Complaint    48 year old male with a history of myocardial bridging of LAD, palpitations, hypertension, hyperlipidemia, prior tobacco use, and ED who presents for follow-up related to hypertension.  Past Medical History    Past Medical History:  Diagnosis Date   Chest pain    a.2005 Cath [facility]): nl cors, pLAd spasm, EF 65%; coronary calcium score of 0 (2018) -> 27 mm mid LAD myocardial bridging but no significant CAD.   Erectile dysfunction    a. In setting of antihypertensive Rx.   Heart murmur as a child    a. 05/2016 Echo: EF 55-60%, no rwma, mild LAE, trace MR/TR.   Hyperlipidemia    Hypertension    a. 05/2016 Renal Duplex:  normal renal arteries.   Tobacco abuse    Past Surgical History:  Procedure Laterality Date   CARDIAC CATHETERIZATION  06/17/2007   Normal coronaries with proximal LAD spasm. Normal LV systolic function.    Allergies  Allergies  Allergen Reactions   Toradol [Ketorolac Tromethamine] Hives     Labs/Other Studies Reviewed    The following studies were reviewed today:  Cardiac Studies & Procedures       ECHOCARDIOGRAM  ECHOCARDIOGRAM COMPLETE 06/02/2016  Narrative *[facility]* [address] [place], [place] [address] [phone]  ------------------------------------------------------------------- Transthoracic Echocardiography  Patient:    Graysen, Docherty MR #:       [id number] Study Date: 06/02/2016 Gender:     M Age:        40 Height:     180 cm Weight:     99 kg BSA:        2.25 m^2 Pt. Status: Room:  ATTENDING    Olga Millers SONOGRAPHER  Dewitt Hoes, RDCS ORDERING     Bryan Lemma, MD REFERRING    Bryan Lemma, MD PERFORMING   [facility], Outpatient  cc:  ------------------------------------------------------------------- LV EF:  60% -   65%  ------------------------------------------------------------------- Indications:      Orthopnea (R06.01).  ------------------------------------------------------------------- History:   Risk factors:  Current tobacco use. Hypertension. Dyslipidemia.  ------------------------------------------------------------------- Study Conclusions  - Left ventricle: The cavity size was normal. Wall thickness was normal. Systolic function was normal. The estimated ejection fraction was in the range of 60% to 65%. Wall motion was normal; there were no regional wall motion abnormalities. - Left atrium: The atrium was mildly dilated.  Impressions:  - Normal LV systolic function; mild LAE; trace MR and TR.  ------------------------------------------------------------------- Study data:  No prior study was available for comparison.  Study status:  Routine.  Procedure:  The patient reported no pain pre or post test. Transthoracic echocardiography. Image quality was adequate.  Study completion:  There were no complications. Transthoracic echocardiography.  M-mode, complete 2D, spectral Doppler, and color Doppler.  Birthdate:  Patient birthdate: [birth year].  Age:  Patient is 47 yr old.  Sex:  Gender: male. BMI: 30.6 kg/m^2.  Blood pressure:     136/100  Patient status: Outpatient.  Study date:  Study date: 06/02/2016. Study time: 01:04 PM.  Location:  [facility]  -------------------------------------------------------------------  ------------------------------------------------------------------- Left ventricle:  The cavity size was normal. Wall thickness was normal. Systolic function was normal. The estimated ejection fraction was in the range of 60% to 65%. Wall motion was normal; there were no regional wall motion abnormalities.  The transmitral flow pattern was normal. The deceleration time of the early transmitral flow velocity was  normal.  ------------------------------------------------------------------- Aortic valve:   Trileaflet; mildly thickened leaflets. Mobility was not restricted.  Doppler:  Transvalvular velocity was within the normal range. There was no stenosis. There was no regurgitation.  ------------------------------------------------------------------- Aorta:  Aortic root: The aortic root was normal in size.  ------------------------------------------------------------------- Mitral valve:   Structurally normal valve.   Mobility was not restricted.  Doppler:  Transvalvular velocity was within the normal range. There was no evidence for stenosis. There was trivial regurgitation.    Peak gradient (D): 5 mm Hg.  ------------------------------------------------------------------- Left atrium:  The atrium was mildly dilated.  ------------------------------------------------------------------- Right ventricle:  The cavity size was normal. Systolic function was normal.  ------------------------------------------------------------------- Pulmonic valve:    Doppler:  Transvalvular velocity was within the normal range. There was no evidence for stenosis. There was trivial regurgitation.  ------------------------------------------------------------------- Tricuspid valve:   Structurally normal valve.    Doppler: Transvalvular velocity was within the normal range. There was trivial regurgitation.  ------------------------------------------------------------------- Right atrium:  The atrium was normal in size.  ------------------------------------------------------------------- Pericardium:  There was no pericardial effusion.  ------------------------------------------------------------------- Systemic veins: Inferior vena cava: The vessel was normal in size.  ------------------------------------------------------------------- Measurements  Left ventricle                         Value         Reference LV ID, ED, PLAX chordal                51.5  mm     43 - 52 LV ID, ES, PLAX chordal                33.3  mm     23 - 38 LV fx shortening, PLAX chordal         35    %      >=29 LV PW thickness, ED                    9.8   mm     --------- IVS/LV PW ratio, ED                    1.08         <=1.3 Stroke volume, 2D                      84.1  ml     --------- Stroke volume/bsa, 2D                  37    ml/m^2 --------- LV e&', lateral                         7.96  cm/s   --------- LV E/e&', lateral                       13.69        --------- LV e&', medial                          10.5  cm/s   --------- LV E/e&', medial                        10.38        --------- LV e&', average  9.23  cm/s   --------- LV E/e&', average                       11.81        ---------  Ventricular septum                     Value        Reference IVS thickness, ED                      10.6  mm     ---------  LVOT                                   Value        Reference LVOT ID, S                             20.3  mm     --------- LVOT area                              3.25  cm^2   --------- LVOT mean velocity, S                  84.5  cm/s   --------- LVOT VTI, S                            25.9  cm     ---------  Aorta                                  Value        Reference Aortic root ID, ED                     31.7  mm     --------- Ascending aorta ID, A-P, S             27.5  mm     ---------  Left atrium                            Value        Reference LA ID, A-P, ES                         43.9  mm     --------- LA ID/bsa, A-P                         1.95  cm/m^2 <=2.2  Mitral valve                           Value        Reference Mitral E-wave peak velocity            109   cm/s   --------- Mitral A-wave peak velocity            64.4  cm/s   --------- Mitral peak gradient, D                5  mm Hg  --------- Mitral E/A ratio, peak                 1.69          ---------  Systemic veins                         Value        Reference Estimated CVP                          3     mm Hg  ---------  Right ventricle                        Value        Reference RV s&', lateral, S                      12.2  cm/s   ---------  Legend: (L)  and  (H)  mark values outside specified reference range.  ------------------------------------------------------------------- Prepared and Electronically Authenticated by  Olga Millers 2017-12-05T14:41:16     CT SCANS  CT CORONARY MORPH W/CTA COR W/SCORE 02/08/2017  Addendum 02/08/2017  6:30 PM ADDENDUM REPORT: 02/08/2017 18:27  CLINICAL DATA:  56 -year-old male with atypical chest pain.  EXAM: Cardiac/Coronary  CT  TECHNIQUE: The patient was scanned on a [employer].  FINDINGS: A 120 kV prospective scan was triggered in the descending thoracic aorta at 111 HU's. Axial non-contrast 3 mm slices were carried out through the heart. The data set was analyzed on a dedicated work station and scored using the Agatson method. Gantry rotation speed was 250 msecs and collimation was .6 mm. 5 mg of iv Metoprolol and 0.8 mg of sl NTG was given. The 3D data set was reconstructed in 5% intervals of the 67-82 % of the R-R cycle. Diastolic phases were analyzed on a dedicated work station using MPR, MIP and VRT modes. The patient received 80 cc of contrast.  Aorta:  Normal size.  No calcifications.  No dissection.  Aortic Valve:  Trileaflet.  No calcifications.  Coronary Arteries:  Normal coronary origin.  Right dominance.  RCA is a large dominant artery that gives rise to PDA. There is no plaque.  Left main is a large artery that gives rise to LAD and LCX arteries. There is a long intramyocardial bridge in the mid LAD measuring 27 mm with dynamic narrowing during systole.  LAD is a large vessel that has no plaque.  LCX is a non-dominant artery that gives rise to one large OM1 branch. There  is no plaque.  Other findings:  Normal pulmonary vein drainage into the left atrium.  Normal let atrial appendage without a thrombus.  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  IMPRESSION: 1. Coronary calcium score of 0. This was 0 percentile for age and sex matched control.  2. Normal coronary origin with right dominance.  3. No evidence of CAD. 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  4. 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  Tobias Alexander   Electronically Signed By: Tobias Alexander On: 02/08/2017 18:27  Narrative EXAM: OVER-READ INTERPRETATION  CT CHEST  The following report is an over-read performed by radiologist Dr. Marinda Elk [facility] on 02/08/2017. This over-read does not include interpretation of cardiac or  coronary anatomy or pathology. The coronary calcium score/coronary CTA interpretation by the cardiologist is attached.  COMPARISON:  None.  FINDINGS: Mediastinum/Nodes: No evidence of mediastinal or hilar lymphadenopathy. No mediastinal masses or fluid collections.  Lungs/Pleura: The visualize lungs show no evidence of pulmonary edema, consolidation, pneumothorax, nodule or pleural fluid.  Upper Abdomen: No acute abnormality.  Musculoskeletal: No chest wall mass or suspicious bone lesions identified.  IMPRESSION: No significant non-cardiovascular findings.  Electronically Signed: By: Irish Lack M.D. On: 02/08/2017 16:06         Recent Labs: No results found for requested labs within last 365 days.  Recent Lipid Panel    Component Value Date/Time   CHOL  07/10/2007 0715    144        ATP III CLASSIFICATION:  <200     mg/dL   Desirable  578-469  mg/dL   Borderline High  >=629    mg/dL   High          TRIG 57 07/10/2007 0715   HDL 44 07/10/2007 0715   CHOLHDL 3.3 07/10/2007 0715   VLDL 11 07/10/2007 0715    LDLCALC  07/10/2007 0715    89        Total Cholesterol/HDL:CHD Risk Coronary Heart Disease Risk Table                     Men   Women  1/2 Average Risk   3.4   3.3  Average Risk       5.0   4.4  2 X Average Risk   9.6   7.1  3 X Average Risk  23.4   11.0        Use the calculated Patient Ratio above and the CHD Risk Table to determine the patient's CHD Risk.        ATP III CLASSIFICATION (LDL):  <100     mg/dL   Optimal  528-413  mg/dL   Near or Above                    Optimal  130-159  mg/dL   Borderline  244-010  mg/dL   High  >272     mg/dL   Very High    History of Present Illness    47 year old male with the above past medical history including myocardial bridging of LAD, palpitations, hypertension, hyperlipidemia, prior tobacco use, and ED.  Renal Dopplers in December 2017 were unremarkable.  Echocardiogram in December 2017 showed EF 65%, no RWMA, trace MR, trace TR, mild LAE.  Coronary CT angiogram in 01/2017 did note myocardial bridge with coronary calcium score of 0, FFR with no significant stenosis.  He has a history of sinus tachycardia, palpitations, stable on carvedilol.  He was last seen in the office on 01/21/2022 and was stable overall from a cardiac standpoint.  He did note bilateral lower extremity edema, BP was also elevated.  He noted mild dyspnea on exertion in the setting of weight gain.  He was transitioned from irbesartan-HCTZ to irbesartan 150 mg daily and HCTZ was increased to 25 mg daily.  He was seen in the ED in March 2024 in the setting of back pain secondary to lumbar radiculopathy.  He presents today for follow-up.  Since his last visit  Hypertension: Myocardial bridge of LAD: Sinus tachycardia: Obesity: Disposition:  Home Medications    Current Outpatient Medications  Medication Sig Dispense Refill   amLODipine (NORVASC) 10 MG tablet Take  1 tablet (10 mg total) by mouth daily. Must keep scheduled appointment for future refills. Thank you. 30  tablet 1   Blood Pressure Monitoring (OMRON 3 SERIES BP MONITOR) DEVI 1 each by Does not apply route daily. 1 each 0   carvedilol (COREG) 25 MG tablet Take 1 tablet (25 mg total) by mouth 2 (two) times daily with a meal. Must keep scheduled appointment for future refills. Thank you. 60 tablet 1   hydrochlorothiazide (HYDRODIURIL) 25 MG tablet Take 1 tablet (25 mg total) by mouth daily. Must keep scheduled appointment for future refills. Thank you. 30 tablet 1   irbesartan (AVAPRO) 150 MG tablet Take 1 tablet (150 mg total) by mouth daily. Must keep scheduled appointment for future refills. Thank you. 30 tablet 1   methylPREDNISolone (MEDROL DOSEPAK) 4 MG TBPK tablet Taper over 6 days 21 tablet 0   Multiple Vitamin (MULTIVITAMIN WITH MINERALS) TABS tablet Take 1 tablet by mouth daily.     potassium chloride SA (KLOR-CON M) 20 MEQ tablet Take 1 tablet (20 mEq total) by mouth daily. 30 tablet 0   sildenafil (VIAGRA) 50 MG tablet Take 1 tablet (50 mg total) by mouth daily as needed for erectile dysfunction. 90 tablet 3   zolpidem (AMBIEN) 10 MG tablet Take 10 mg by mouth at bedtime.     No current facility-administered medications for this visit.     Review of Systems    ***.  All other systems reviewed and are otherwise negative except as noted above.    Physical Exam    VS:  There were no vitals taken for this visit. , BMI There is no height or weight on file to calculate BMI.     GEN: Well nourished, well developed, in no acute distress. HEENT: normal. Neck: Supple, no JVD, carotid bruits, or masses. Cardiac: RRR, no murmurs, rubs, or gallops. No clubbing, cyanosis, edema.  Radials/DP/PT 2+ and equal bilaterally.  Respiratory:  Respirations regular and unlabored, clear to auscultation bilaterally. GI: Soft, nontender, nondistended, BS + x 4. MS: no deformity or atrophy. Skin: warm and dry, no rash. Neuro:  Strength and sensation are intact. Psych: Normal affect.  Accessory Clinical  Findings    ECG personally reviewed by me today - *** - no acute changes.   Lab Results  Component Value Date   WBC 13.8 (H) 12/07/2016   HGB 15.4 12/07/2016   HCT 48.1 12/07/2016   MCV 90.9 12/07/2016   PLT 357 12/07/2016   Lab Results  Component Value Date   CREATININE 0.79 10/24/2020   BUN 12 10/24/2020   NA 138 10/24/2020   K 3.8 10/24/2020   CL 107 10/24/2020   CO2 25 10/24/2020   Lab Results  Component Value Date   ALT 34 12/07/2016   AST 19 12/07/2016   ALKPHOS 43 12/07/2016   BILITOT 0.7 12/07/2016   Lab Results  Component Value Date   CHOL  07/10/2007    144        ATP III CLASSIFICATION:  <200     mg/dL   Desirable  161-096  mg/dL   Borderline High  >=045    mg/dL   High          HDL 44 07/10/2007   LDLCALC  07/10/2007    89        Total Cholesterol/HDL:CHD Risk Coronary Heart Disease Risk Table  Men   Women  1/2 Average Risk   3.4   3.3  Average Risk       5.0   4.4  2 X Average Risk   9.6   7.1  3 X Average Risk  23.4   11.0        Use the calculated Patient Ratio above and the CHD Risk Table to determine the patient's CHD Risk.        ATP III CLASSIFICATION (LDL):  <100     mg/dL   Optimal  829-562  mg/dL   Near or Above                    Optimal  130-159  mg/dL   Borderline  130-865  mg/dL   High  >784     mg/dL   Very High   TRIG 57 69/62/9528   CHOLHDL 3.3 07/10/2007    No results found for: "HGBA1C"  Assessment & Plan    1.  ***  No BP recorded.  {Refresh Note OR Click here to enter BP  :1}***   Joylene Grapes, NP 12/11/2022, 12:31 PM

## 2022-12-30 ENCOUNTER — Other Ambulatory Visit: Payer: Self-pay | Admitting: Family

## 2022-12-30 DIAGNOSIS — I10 Essential (primary) hypertension: Secondary | ICD-10-CM

## 2022-12-30 NOTE — Telephone Encounter (Signed)
Patient of Dr. Harding. Please review for refill. Thank you!  

## 2023-01-11 ENCOUNTER — Ambulatory Visit: Payer: Self-pay | Attending: Nurse Practitioner | Admitting: Physician Assistant

## 2023-01-12 NOTE — Progress Notes (Signed)
 This encounter was created in error - please disregard.

## 2023-01-28 ENCOUNTER — Telehealth: Payer: Self-pay | Admitting: Cardiology

## 2023-01-28 ENCOUNTER — Other Ambulatory Visit: Payer: Self-pay | Admitting: Family

## 2023-01-28 DIAGNOSIS — I10 Essential (primary) hypertension: Secondary | ICD-10-CM

## 2023-01-28 DIAGNOSIS — R600 Localized edema: Secondary | ICD-10-CM

## 2023-01-28 MED ORDER — IRBESARTAN 150 MG PO TABS
150.0000 mg | ORAL_TABLET | Freq: Every day | ORAL | 0 refills | Status: DC
Start: 2023-01-28 — End: 2023-02-08

## 2023-01-28 MED ORDER — HYDROCHLOROTHIAZIDE 25 MG PO TABS
25.0000 mg | ORAL_TABLET | Freq: Every day | ORAL | 0 refills | Status: DC
Start: 2023-01-28 — End: 2023-02-08

## 2023-01-28 MED ORDER — AMLODIPINE BESYLATE 10 MG PO TABS
10.0000 mg | ORAL_TABLET | Freq: Every day | ORAL | 0 refills | Status: DC
Start: 2023-01-28 — End: 2023-02-08

## 2023-01-28 NOTE — Telephone Encounter (Signed)
*  STAT* If patient is at the pharmacy, call can be transferred to refill team.   1. Which medications need to be refilled? (please list name of each medication and dose if known) irbesartan (AVAPRO) 150 MG tablet hydrochlorothiazide (HYDRODIURIL) 25 MG tablet amLODipine (NORVASC) 10 MG tablet  2. Which pharmacy/location (including street and city if local pharmacy) is medication to be sent to?[facility] [address], [place] - [address]   3. Do they need a 30 day or 90 day supply? 90 Day Supply  Pt is out of the medication. Pt is scheduled for 08/12.

## 2023-01-28 NOTE — Telephone Encounter (Signed)
Pt's medications were sent to pt's pharmacy as requested. Confirmation received.  

## 2023-01-28 NOTE — Telephone Encounter (Signed)
Rx request sent to pharmacy.  

## 2023-02-06 NOTE — Progress Notes (Unsigned)
Cardiology Clinic Note   Patient Name: William Berry Date of Encounter: 02/08/2023  Primary Care Provider:  Ignatius Specking, MD Primary Cardiologist:  Bryan Lemma, MD  Patient Profile    47-year-old male with history of hyperlipidemia, hypertension, tobacco abuse, noncardiac chest pain, with normal course per cardiac cath in 2005 coronary calcium score of 0 in 2018.  Most recent echocardiogram December 2017 normal LVEF of 60 to 65%.  Last seen in the office by Alver Sorrow on 01/21/2022 for complaints of lower extremity edema.  HCTZ was increased to 25 mg daily due to elevated blood pressure as well as lower extremity edema.  He was to continue irbesartan 150 mg daily.  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   Erectile dysfunction    a. In setting of antihypertensive Rx.   Heart murmur as a child    a. 05/2016 Echo: EF 55-60%, no rwma, mild LAE, trace MR/TR.   Hyperlipidemia    Hypertension    a. 05/2016 Renal Duplex:  normal renal arteries.   Tobacco abuse    Past Surgical History:  Procedure Laterality Date   CARDIAC CATHETERIZATION  06/17/2007   Normal coronaries with proximal LAD spasm. Normal LV systolic function.    Allergies  Allergies  Allergen Reactions   Toradol [Ketorolac Tromethamine] Hives    History of Present Illness    [name] returns today for ongoing assessment and management of hypertension, and chronic lower extremity edema.  When last seen in the office the patient's antihypertensive medication was adjusted to increase hydrochlorothiazide to 25 mg daily and he was to continue irbesartan and amlodipine.  Since being seen last, the patient has had significant issues with right sided sciatica pain.  He is going to follow-up with practice in [place] that may  be able to offer him some relief.  He states that when he is in a good bit of pain his blood pressure is elevated but for the most part his blood pressures running in the 130s over 70s at home.  He is tolerating medication adjustments without complaints of dizziness, or near syncope.  Home Medications    Current Outpatient Medications  Medication Sig Dispense Refill   sildenafil (VIAGRA) 50 MG tablet Take 1 tablet (50 mg total) by mouth daily as needed for erectile dysfunction. 90 tablet 3   zolpidem (AMBIEN) 10 MG tablet Take 10 mg by mouth at bedtime.     amLODipine (NORVASC) 10 MG tablet Take 1 tablet (10 mg total) by mouth daily. 90 tablet 0   Blood Pressure Monitoring (OMRON 3 SERIES BP MONITOR) DEVI 1 each by Does not apply route daily. 1 each 0   carvedilol (COREG) 25 MG tablet Take 1 tablet (25 mg total) by mouth 2 (two) times daily with a meal. 60 tablet 0   hydrochlorothiazide (HYDRODIURIL) 25 MG tablet Take 1 tablet (25 mg total) by mouth daily. 90 tablet 0   irbesartan (AVAPRO) 150 MG tablet Take 1 tablet (150 mg total) by mouth daily. 90 tablet 0   methylPREDNISolone (MEDROL DOSEPAK) 4 MG TBPK tablet Taper over 6 days 21 tablet 0   Multiple Vitamin (MULTIVITAMIN WITH MINERALS) TABS tablet Take 1 tablet by mouth daily.     potassium chloride SA (KLOR-CON M) 20  MEQ tablet Take 1 tablet (20 mEq total) by mouth daily. 30 tablet 0   No current facility-administered medications for this visit.     Family History    Family History  Problem Relation Age of Onset   Hypertension Mother    Heart attack Mother 52   Hypertension Father    Heart attack Father 70   Hypertension Sister    Hypertension Brother    Healthy Sister    Cancer Maternal Uncle        bone   Heart failure Maternal Grandmother    COPD Maternal Grandmother    COPD Maternal Grandfather    Colon cancer Neg Hx    Colon polyps Neg Hx    He indicated that his mother is deceased. He indicated that his father is  deceased. He indicated that both of his sisters are alive. He indicated that his brother is alive. He indicated that his maternal grandmother is deceased. He indicated that his maternal grandfather is deceased. He indicated that his paternal grandmother is deceased. He indicated that his paternal grandfather is deceased. He indicated that his maternal uncle is deceased. He indicated that the status of his neg hx is unknown.  Social History    Social History   Socioeconomic History   Marital status: Married    Spouse name: Not on file   Number of children: 0   Years of education: 14   Highest education level: Not on file  Occupational History   Occupation: Administrator, Civil Service: [employer]    Comment: call center  Tobacco Use   Smoking status: Some Days    Current packs/day: 0.00    Types: Cigarettes    Last attempt to quit: 08/01/2018    Years since quitting: 4.5   Smokeless tobacco: Never  Vaping Use   Vaping status: Never Used  Substance and Sexual Activity   Alcohol use: Not Currently    Comment: occasionally   Drug use: No   Sexual activity: Yes  Other Topics Concern   Not on file  Social History Narrative   Lives with wife. Married 14 years. No children   Social Determinants of Corporate investment banker Strain: Not on file  Food Insecurity: Not on file  Transportation Needs: Not on file  Physical Activity: Not on file  Stress: Not on file  Social Connections: Not on file  Intimate Partner Violence: Not on file     Review of Systems    General:  No chills, fever, night sweats or weight changes.  Cardiovascular:  No chest pain, dyspnea on exertion, edema, orthopnea, palpitations, paroxysmal nocturnal dyspnea. Dermatological: No rash, lesions/masses Respiratory: No cough, dyspnea Urologic: No hematuria, dysuria Abdominal:   No nausea, vomiting, diarrhea, bright red blood per rectum, melena, or hematemesis Neurologic:  No visual changes, wkns, changes in mental  status.  Significant right sided sciatica neuropathic pain. All other systems reviewed and are otherwise negative except as noted above.  EKG Interpretation Date/Time:  Monday February 08 2023 15:27:47 EDT Ventricular Rate:  95 PR Interval:  168 QRS Duration:  90 QT Interval:  364 QTC Calculation: 457 R Axis:   11  Text Interpretation: Normal sinus rhythm Minimal voltage criteria for LVH, may be normal variant ( R in aVL ) T wave abnormality, consider inferior ischemia When compared with ECG of 22-Apr-2016 10:14, ST now depressed in Inferior leads Inverted T waves have replaced nonspecific T wave abnormality in Inferior leads Confirmed by  Joni Reining ([id number]) on 02/08/2023 4:24:08 PM    Physical Exam    VS:  BP 136/80 (BP Location: Left Arm, Patient Position: Sitting, Cuff Size: Normal)   Pulse 98   Ht 5\' 11"  (1.803 m)   Wt 235 lb (106.6 kg)   SpO2 96%   BMI 32.78 kg/m  , BMI Body mass index is 32.78 kg/m.     GEN: Well nourished, well developed, in no acute distress. HEENT: normal. Neck: Supple, no JVD, carotid bruits, or masses. Cardiac: RRR, no murmurs, rubs, or gallops. No clubbing, cyanosis, edema.  Radials/DP/PT 2+ and equal bilaterally.  Respiratory:  Respirations regular and unlabored, clear to auscultation bilaterally. GI: Soft, nontender, nondistended, BS + x 4. MS: no deformity or atrophy. Skin: warm and dry, no rash. Neuro:  Strength and sensation are intact. Psych: Normal affect.  EKG Interpretation Date/Time:  Monday February 08 2023 15:27:47 EDT Ventricular Rate:  95 PR Interval:  168 QRS Duration:  90 QT Interval:  364 QTC Calculation: 457 R Axis:   11  Text Interpretation: Normal sinus rhythm Minimal voltage criteria for LVH, may be normal variant ( R in aVL ) T wave abnormality, consider inferior ischemia When compared with ECG of 22-Apr-2016 10:14, ST now depressed in Inferior leads Inverted T waves have replaced nonspecific T wave abnormality in  Inferior leads Confirmed by Joni Reining [phone]) on 02/08/2023 4:24:08 PM   Lab Results  Component Value Date   WBC 13.8 (H) 12/07/2016   HGB 15.4 12/07/2016   HCT 48.1 12/07/2016   MCV 90.9 12/07/2016   PLT 357 12/07/2016   Lab Results  Component Value Date   CREATININE 0.79 10/24/2020   BUN 12 10/24/2020   NA 138 10/24/2020   K 3.8 10/24/2020   CL 107 10/24/2020   CO2 25 10/24/2020   Lab Results  Component Value Date   ALT 34 12/07/2016   AST 19 12/07/2016   ALKPHOS 43 12/07/2016   BILITOT 0.7 12/07/2016   Lab Results  Component Value Date   CHOL  07/10/2007    144        ATP III CLASSIFICATION:  <200     mg/dL   Desirable  098-119  mg/dL   Borderline High  >=147    mg/dL   High          HDL 44 07/10/2007   LDLCALC  07/10/2007    89        Total Cholesterol/HDL:CHD Risk Coronary Heart Disease Risk Table                     Men   Women  1/2 Average Risk   3.4   3.3  Average Risk       5.0   4.4  2 X Average Risk   9.6   7.1  3 X Average Risk  23.4   11.0        Use the calculated Patient Ratio above and the CHD Risk Table to determine the patient's CHD Risk.        ATP III CLASSIFICATION (LDL):  <100     mg/dL   Optimal  829-562  mg/dL   Near or Above                    Optimal  130-159  mg/dL   Borderline  130-865  mg/dL   High  >784     mg/dL   Very High  TRIG 57 07/10/2007   CHOLHDL 3.3 07/10/2007    No results found for: "HGBA1C"   Review of Prior Studies EKG Interpretation Date/Time:  Monday February 08 2023 15:27:47 EDT Ventricular Rate:  95 PR Interval:  168 QRS Duration:  90 QT Interval:  364 QTC Calculation: 457 R Axis:   11  Text Interpretation: Normal sinus rhythm Minimal voltage criteria for LVH, may be normal variant ( R in aVL ) T wave abnormality, consider inferior ischemia When compared with ECG of 22-Apr-2016 10:14, ST now depressed in Inferior leads Inverted T waves have replaced nonspecific T wave abnormality in Inferior  leads Confirmed by Joni Reining [phone]) on 02/08/2023 4:24:08 PM  Coronary CTA 1. Left Main:  No significant stenosis.   2. LAD: No significant stenosis. Proximal LAD FFR: 0.92, mid LAD FFR: 0.87, distal LAD FFR: 0.84. 3. LCX: No significant stenosis. 4. RCA: No significant stenosis.   IMPRESSION: 1.  CT FFR analysis didn't show any significant stenosis.   Echocardiogram 06/02/2016 Left ventricle: The cavity size was normal. Wall thickness was    normal. Systolic function was normal. The estimated ejection    fraction was in the range of 60% to 65%. Wall motion was normal;    there were no regional wall motion abnormalities.  - Left atrium: The atrium was mildly dilated.   Assessment & Plan   1.  Hypertension: He has had good result from adjustment in medication with increased dose of HCTZ to 25 mg from 12.5 mL, he is to continue amlodipine 10 mg daily, carvedilol 25 mg twice daily, and irbesartan 150 mg daily.  He is getting potassium replacement with 20 mEq of potassium daily.  Refills are provided.  Repeat BMET is ordered today.  2.  Chronic sciatic pain: Right sided sciatica pain which is chronic for him.  With exacerbations with prolonged sitting.  He is going to follow-up with a practice in [place], with intervention to help relieve this.  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  He is asymptomatic or chest discomfort, dyspnea on exertion.        Signed, Bettey Mare. Liborio Nixon, ANP, AACC   02/08/2023 4:24 PM      Office [phone] Fax [phone]  Notice: This dictation was prepared with Dragon dictation along with smaller phrase technology. Any transcriptional errors that result from this process are unintentional and may not be corrected upon review.

## 2023-02-08 ENCOUNTER — Encounter: Payer: Self-pay | Admitting: Adult Health

## 2023-02-08 ENCOUNTER — Ambulatory Visit: Payer: 59 | Attending: Adult Health | Admitting: Adult Health

## 2023-02-08 VITALS — BP 136/80 | HR 98 | Ht 71.0 in | Wt 235.0 lb

## 2023-02-08 DIAGNOSIS — M5431 Sciatica, right side: Secondary | ICD-10-CM

## 2023-02-08 DIAGNOSIS — R000 Tachycardia, unspecified: Secondary | ICD-10-CM

## 2023-02-08 DIAGNOSIS — I10 Essential (primary) hypertension: Secondary | ICD-10-CM

## 2023-02-08 MED ORDER — IRBESARTAN 150 MG PO TABS
150.0000 mg | ORAL_TABLET | Freq: Every day | ORAL | 0 refills | Status: DC
Start: 2023-02-08 — End: 2023-12-03

## 2023-02-08 MED ORDER — HYDROCHLOROTHIAZIDE 25 MG PO TABS
25.0000 mg | ORAL_TABLET | Freq: Every day | ORAL | 0 refills | Status: DC
Start: 2023-02-08 — End: 2024-01-19

## 2023-02-08 MED ORDER — POTASSIUM CHLORIDE CRYS ER 20 MEQ PO TBCR
20.0000 meq | EXTENDED_RELEASE_TABLET | Freq: Every day | ORAL | 0 refills | Status: DC
Start: 2023-02-08 — End: 2024-01-19

## 2023-02-08 MED ORDER — CARVEDILOL 25 MG PO TABS
25.0000 mg | ORAL_TABLET | Freq: Two times a day (BID) | ORAL | 0 refills | Status: DC
Start: 2023-02-08 — End: 2023-07-06

## 2023-02-08 MED ORDER — AMLODIPINE BESYLATE 10 MG PO TABS: 10.0000 mg | ORAL_TABLET | Freq: Every day | ORAL | 0 refills | Status: DC

## 2023-02-08 NOTE — Patient Instructions (Signed)
Medication Instructions:  No Changes *If you need a refill on your cardiac medications before your next appointment, please call your pharmacy*   Lab Work: BMET If you have labs (blood work) drawn today and your tests are completely normal, you will receive your results only by: MyChart Message (if you have MyChart) OR A paper copy in the mail If you have any lab test that is abnormal or we need to change your treatment, we will call you to review the results.   Testing/Procedures: No Testing   Follow-Up: At [facility], you and your health needs are our priority.  As part of our continuing mission to provide you with exceptional heart care, we have created designated Provider Care Teams.  These Care Teams include your primary Cardiologist (physician) and Advanced Practice Providers (APPs -  Physician Assistants and Nurse Practitioners) who all work together to provide you with the care you need, when you need it.  We recommend signing up for the patient portal called "MyChart".  Sign up information is provided on this After Visit Summary.  MyChart is used to connect with patients for Virtual Visits (Telemedicine).  Patients are able to view lab/test results, encounter notes, upcoming appointments, etc.  Non-urgent messages can be sent to your provider as well.   To learn more about what you can do with MyChart, go to [email or web address].    Your next appointment:   1 year(s)  Provider:   Bryan Lemma, MD

## 2023-02-09 ENCOUNTER — Telehealth: Payer: Self-pay

## 2023-02-09 NOTE — Telephone Encounter (Addendum)
Called patient regarding results. Left detailed message for patient regarding results. Letter mailed on 02/09/23.----- Message from Joni Reining sent at 02/09/2023  7:32 AM EDT ----- I have reviewed the labs.  All are normal  No changes in her regimen.   KL

## 2023-07-04 ENCOUNTER — Other Ambulatory Visit: Payer: Self-pay | Admitting: Adult Health

## 2023-07-04 DIAGNOSIS — I10 Essential (primary) hypertension: Secondary | ICD-10-CM

## 2023-12-03 ENCOUNTER — Telehealth: Payer: Self-pay | Admitting: Cardiology

## 2023-12-03 ENCOUNTER — Other Ambulatory Visit: Payer: Self-pay | Admitting: Adult Health

## 2023-12-03 DIAGNOSIS — I10 Essential (primary) hypertension: Secondary | ICD-10-CM

## 2023-12-03 MED ORDER — IRBESARTAN 150 MG PO TABS
150.0000 mg | ORAL_TABLET | Freq: Every day | ORAL | 0 refills | Status: DC
Start: 2023-12-03 — End: 2024-01-19

## 2023-12-03 NOTE — Telephone Encounter (Signed)
*  STAT* If patient is at the pharmacy, call can be transferred to refill team.   1. Which medications need to be refilled? (please list name of each medication and dose if known) irbesartan  (AVAPRO ) 150 MG tablet   2. Which pharmacy/location (including street and city if local pharmacy) is medication to be sent to? [facility] [address], [place] - [address]   3. Do they need a 30 day or 90 day supply? 90

## 2023-12-03 NOTE — Telephone Encounter (Signed)
 RX sent to requested Pharmacy

## 2024-01-18 ENCOUNTER — Telehealth: Payer: Self-pay | Admitting: Cardiology

## 2024-01-18 DIAGNOSIS — M5431 Sciatica, right side: Secondary | ICD-10-CM

## 2024-01-18 DIAGNOSIS — I10 Essential (primary) hypertension: Secondary | ICD-10-CM

## 2024-01-18 NOTE — Telephone Encounter (Signed)
*  STAT* If patient is at the pharmacy, call can be transferred to refill team.   1. Which medications need to be refilled? (please list name of each medication and dose if known) amLODipine  (NORVASC ) 10 MG tablet   Take 1 tablet (10 mg total) by mouth daily.  carvedilol  (COREG ) 25 MG tablet   TAKE 1 TABLET BY MOUTH TWICE DAILY WITH A MEAL    hydrochlorothiazide  (HYDRODIURIL ) 25 MG tablet    Take 1 tablet (25 mg total) by mouth daily.  irbesartan  (AVAPRO ) 150 MG tablet   Take 1 tablet (150 mg total) by mouth daily.     potassium chloride  SA (KLOR-CON  M) 20 MEQ tablet (Expired)  Take 1 tablet (20 mEq total) by mouth daily.  2. Would you like to learn more about the convenience, safety, & potential cost savings by using the [facility]? No   3. Are you open to using the [facility] No   4. Which pharmacy/location (including street and city if local pharmacy) is medication to be sent to? [facility] [address], [place] - [address]     5. Do they need a 30 day or 90 day supply?  30 Day Supply  Pt is scheduled for 02/09/24 w/ Hao Meng, PA

## 2024-01-19 MED ORDER — CARVEDILOL 25 MG PO TABS
25.0000 mg | ORAL_TABLET | Freq: Two times a day (BID) | ORAL | 0 refills | Status: DC
Start: 2024-01-19 — End: 2024-05-04

## 2024-01-19 MED ORDER — POTASSIUM CHLORIDE CRYS ER 20 MEQ PO TBCR: 20.0000 meq | EXTENDED_RELEASE_TABLET | Freq: Every day | ORAL | 0 refills | Status: DC

## 2024-01-19 MED ORDER — AMLODIPINE BESYLATE 10 MG PO TABS
10.0000 mg | ORAL_TABLET | Freq: Every day | ORAL | 0 refills | Status: DC
Start: 2024-01-19 — End: 2024-05-04

## 2024-01-19 MED ORDER — IRBESARTAN 150 MG PO TABS
150.0000 mg | ORAL_TABLET | Freq: Every day | ORAL | 0 refills | Status: DC
Start: 2024-01-19 — End: 2024-05-04

## 2024-01-19 MED ORDER — HYDROCHLOROTHIAZIDE 25 MG PO TABS: 25.0000 mg | ORAL_TABLET | Freq: Every day | ORAL | 0 refills | Status: DC

## 2024-01-19 NOTE — Telephone Encounter (Signed)
 Refills sent to [facility], per pt's request.  Pt scheduled to see Scot Ford, PA-C 02/09/24.

## 2024-02-09 ENCOUNTER — Ambulatory Visit: Admitting: Physician Assistant

## 2024-03-14 ENCOUNTER — Ambulatory Visit: Attending: Cardiology | Admitting: Cardiology

## 2024-03-14 ENCOUNTER — Encounter: Payer: Self-pay | Admitting: Cardiology

## 2024-03-14 VITALS — BP 136/88 | HR 88 | Ht 71.0 in | Wt 234.0 lb

## 2024-03-14 DIAGNOSIS — E7849 Other hyperlipidemia: Secondary | ICD-10-CM | POA: Diagnosis not present

## 2024-03-14 DIAGNOSIS — Q245 Malformation of coronary vessels: Secondary | ICD-10-CM

## 2024-03-14 DIAGNOSIS — R000 Tachycardia, unspecified: Secondary | ICD-10-CM

## 2024-03-14 DIAGNOSIS — I10 Essential (primary) hypertension: Secondary | ICD-10-CM

## 2024-03-14 NOTE — Progress Notes (Signed)
 Cardiology Office Note:  .   Date:  03/20/2024  ID:  William Berry, DOB 1975-09-17, MRN [id number] PCP: Rosamond Leta NOVAK, MD  [facility] Providers Cardiologist:  Alm Clay, MD Cardiology APP:  Vannie Reche RAMAN, NP     Chief Complaint  Patient presents with   Follow-up    Annual follow-up.   Hypertension    Relatively well-controlled.    Patient Profile: .     William Berry is a mildly obese 48 y.o. male with a PMH notable for (accelerated/poorly controlled) HTN, HLD, sinus tachycardia and myocardial bridge noted in the LAD noted on Coronary CTA (as well as cardiac catheterization in 2008) who presents here for annual follow-up at the request of Rosamond Leta B, MD.  I last saw William in February 2022 for follow-up evaluation of atypical chest pain (had been seen at the [facility] ER with accelerated hypertension).  His medication list was not accurate as he was not able to tell me what dose of which medicine he was taking.  His main symptom he noted was feeling like his heart rate racing and roller coaster with him appreciable in his chest lasting a few seconds off-and-on.  No chest pain.  He had an S4 gallop on exam.  Converted him from losartan  to irbesartan -HCTZ 150-12.5 mg daily and increase the carvedilol  to 25 mg in the morning and 37.5 mg in p.m.    William Berry has been followed at [facility] in the interim by Lamarr Satterfield, NP on February 08, 2023 (he was seen once by Reche Vannie, NP from our [facility] when seen in July 2023 where his HCTZ dose was increased to 25 mg due to complaints of edema).  His major issue he was complaining about was right sided sciatica pain.  When his pain was controlled, his blood pressures would run in the 130s over 70s.  He was started on 40 mg of potassium due to hypokalemia.  No chest pain.  Subjective  Discussed the use of AI scribe software for clinical note transcription with the patient,  who gave verbal consent to proceed.  History of Present Illness William Berry is a 48 year old male with hypertension and a myocardial bridge who presents for a routine check-up and medication refill.  His past medical history includes a heart catheterization in 2008 and an echocardiogram in 2017, which showed normal pump function with an ejection fraction of 60-65% and normal wall motion. The left atrium was noted to be slightly dilated. In August 2028, a coronary CT angiogram revealed a calcium score of zero and a myocardial bridge, but no evidence of coronary disease.  He has no new symptoms or significant changes in his health since his last visit. No heart racing, skipping, flipping, chest tightness, pressure, pain, or shortness of breath. He also denies recent illnesses, fevers, hospital stays, headaches, or dizziness.   He is currently on a regimen of amlodipine  10 mg, carvedilol  25 mg twice a day, hydrochlorothiazide  25 mg, and irbesartan  150 mg for blood pressure management. He also uses Viagra  as needed, Ambien for sleep as needed, and potassium supplements. He no longer uses a dose pack and does not regularly check his blood pressure at home, but reports readings of approximately 136/80 when checked at [facility].  He is scheduled to see his primary care physician in October for cholesterol level checks, as there have been no recent labs since 2022. He is not currently  on cholesterol medication.     Objective   Pertinent Meds: Amlodipine  10 mg daily, carvedilol  25 mg twice daily, HCTZ 25 mg daily, irbesartan  150 mg daily.;  Klor-Con  20 mg daily PRN Viagra  50 mg PRN Ambien 10 nightly  Studies Reviewed: SABRA   EKG Interpretation Date/Time:  Tuesday March 14 2024 12:47:07 EDT Ventricular Rate:  88 PR Interval:  198 QRS Duration:  78 QT Interval:  362 QTC Calculation: 438 R Axis:   13  Text Interpretation: Normal sinus rhythm Normal ECG When compared with ECG of 08-Feb-2023  15:27, ST no longer depressed in Inferior leads T wave inversion no longer evident in Inferior leads Confirmed by Anner Lenis ([id number]) on 03/14/2024 1:02:10 PM    No recent labs Results RADIOLOGY Coronary CT Angiogram: Calcium score 0, no evidence of coronary disease, long intramyocardial bridge in the mid LAD measuring 27 mm with dynamic narrowing during systole.  Distal LAD FFR CT 0.84.  (02/08/2023)  DIAGNOSTIC Echocardiogram: Ejection fraction 60-65%, normal wall motion, left atrium dilated (2017) Cardiac Catheterization: Normal coronaries with proximal LAD spasm and mid vessel myocardial bridge   Risk Assessment/Calculations:          Physical Exam:   VS:  BP 136/88   Pulse 88   Ht 5' 11 (1.803 m)   Wt 234 lb (106.1 kg)   SpO2 98%   BMI 32.64 kg/m    Wt Readings from Last 3 Encounters:  03/14/24 234 lb (106.1 kg)  02/08/23 235 lb (106.6 kg)  09/12/22 235 lb (106.6 kg)      GEN: Well nourished, well groomed in no acute distress; healthy-appearing NECK: No JVD; No carotid bruits CARDIAC: RRR, Normal S1, S2; S4 gallop but otherwise no murmurs or rubs. RESPIRATORY:  Clear to auscultation without rales, wheezing or rhonchi ; nonlabored, good air movement. ABDOMEN: Soft, non-tender, non-distended EXTREMITIES:  No edema; No deformity      ASSESSMENT AND PLAN: .    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 (Chronic)   Myocardial bridge on coronary CT angiogram without coronary artery disease. No chest pain reported. - Monitor for chest pain, especially with increased blood pressure. - Continue carvedilol  25 mg twice daily along with amlodipine  10 mg daily.      Essential hypertension - Primary (Chronic)   Blood pressure at upper normal limit with occasional elevated diastolic values. Managed with amlodipine ,  carvedilol , hydrochlorothiazide , and irbesartan . No symptoms reported. - Continue amlodipine  10 mg, carvedilol  25 mg twice daily, hydrochlorothiazide  25 mg, irbesartan  150 mg. - Consider increasing irbesartan  or adding spironolactone if blood pressure rises.      Relevant Orders   EKG 12-Lead (Completed)   HLD (hyperlipidemia) (Chronic)   Hyperlipidemia by report, but I do not have any recent labs. Basilar Coronary Calcium Score, no acute need to treat but would like to see LDL least less than 100  Defer management to PCP.        Other   Sinus tachycardia (Chronic)   Short spells of tachycardia lasting few seconds to a minute.  Overall pretty well-controlled.  As long as his pain is controlled, he does okay.   - Continue current high dose carvedilol  25 mg twice daily.      Relevant Orders   EKG 12-Lead (Completed)        Follow-Up: Return in  about 1 year (around 03/14/2025) for Follow-up with APP, Alternating annual follow-ups APP and MD.     Signed, Alm MICAEL Clay, MD, MS Alm Clay, M.D., M.S. Interventional Cardiologist  [facility] Pager # [phone]

## 2024-03-14 NOTE — Patient Instructions (Addendum)
 Medication Instructions:   No changes  *If you need a refill on your cardiac medications before your next appointment, please call your pharmacy*   Lab Work: Not needed    Testing/Procedures: Not needed   Follow-Up: At [facility], you and your health needs are our priority.  As part of our continuing mission to provide you with exceptional heart care, we have created designated Provider Care Teams.  These Care Teams include your primary Cardiologist (physician) and Advanced Practice Providers (APPs -  Physician Assistants and Nurse Practitioners) who all work together to provide you with the care you need, when you need it.     Your next appointment:   12 month(s)  The format for your next appointment:   In Person  Provider:    Floretta Finder  and then 24 months  Alm Clay, MD

## 2024-03-20 ENCOUNTER — Encounter: Payer: Self-pay | Admitting: Cardiology

## 2024-03-20 NOTE — Assessment & Plan Note (Addendum)
 Hyperlipidemia by report, but I do not have any recent labs. Basilar Coronary Calcium Score, no acute need to treat but would like to see LDL least less than 100  Defer management to PCP.

## 2024-03-20 NOTE — Assessment & Plan Note (Signed)
 Myocardial bridge on coronary CT angiogram without coronary artery disease. No chest pain reported. - Monitor for chest pain, especially with increased blood pressure. - Continue carvedilol  25 mg twice daily along with amlodipine  10 mg daily.

## 2024-03-20 NOTE — Assessment & Plan Note (Signed)
 Short spells of tachycardia lasting few seconds to a minute.  Overall pretty well-controlled.  As long as his pain is controlled, he does okay.   - Continue current high dose carvedilol  25 mg twice daily.

## 2024-03-20 NOTE — Assessment & Plan Note (Signed)
 Blood pressure at upper normal limit with occasional elevated diastolic values. Managed with amlodipine , carvedilol , hydrochlorothiazide , and irbesartan . No symptoms reported. - Continue amlodipine  10 mg, carvedilol  25 mg twice daily, hydrochlorothiazide  25 mg, irbesartan  150 mg. - Consider increasing irbesartan  or adding spironolactone if blood pressure rises.

## 2024-05-04 ENCOUNTER — Other Ambulatory Visit: Payer: Self-pay | Admitting: Cardiology

## 2024-05-04 DIAGNOSIS — M5431 Sciatica, right side: Secondary | ICD-10-CM

## 2024-05-04 DIAGNOSIS — I10 Essential (primary) hypertension: Secondary | ICD-10-CM

## 2024-05-04 MED ORDER — POTASSIUM CHLORIDE CRYS ER 20 MEQ PO TBCR: 20.0000 meq | EXTENDED_RELEASE_TABLET | Freq: Every day | ORAL | 3 refills | Status: AC

## 2024-05-04 MED ORDER — IRBESARTAN 150 MG PO TABS: 150.0000 mg | ORAL_TABLET | Freq: Every day | ORAL | 3 refills | Status: AC

## 2024-05-04 MED ORDER — CARVEDILOL 25 MG PO TABS: 25.0000 mg | ORAL_TABLET | Freq: Two times a day (BID) | ORAL | 3 refills | Status: AC

## 2024-05-04 NOTE — Telephone Encounter (Signed)
*  STAT* If patient is at the pharmacy, call can be transferred to refill team.   1. Which medications need to be refilled? (please list name of each medication and dose if known)   carvedilol  (COREG ) 25 MG tablet    irbesartan  (AVAPRO ) 150 MG tablet    potassium chloride  SA (KLOR-CON  M) 20 MEQ tablet    2. Which pharmacy/location (including street and city if local pharmacy) is medication to be sent to? [facility] [address], [place] - [address] Phone: [phone]  Fax: [phone]     3. Do they need a 30 day or 90 day supply? 90
